# Patient Record
Sex: Female | Born: 1985 | Race: White | Hispanic: No | Marital: Married | Smoking: Never smoker
Health system: Southern US, Community
[De-identification: ages and names within clinical notes are randomized; demographics above are authoritative.]

## PROBLEM LIST (undated history)

## (undated) ENCOUNTER — Inpatient Hospital Stay (HOSPITAL_COMMUNITY): Payer: Self-pay

## (undated) DIAGNOSIS — D649 Anemia, unspecified: Secondary | ICD-10-CM

## (undated) DIAGNOSIS — G43909 Migraine, unspecified, not intractable, without status migrainosus: Secondary | ICD-10-CM

---

## 2004-12-27 ENCOUNTER — Emergency Department (HOSPITAL_COMMUNITY): Admission: EM | Admit: 2004-12-27 | Discharge: 2004-12-27 | Payer: Self-pay | Admitting: Emergency Medicine

## 2005-04-09 ENCOUNTER — Emergency Department (HOSPITAL_COMMUNITY): Admission: EM | Admit: 2005-04-09 | Discharge: 2005-04-09 | Payer: Self-pay | Admitting: Emergency Medicine

## 2005-07-26 ENCOUNTER — Other Ambulatory Visit: Admission: RE | Admit: 2005-07-26 | Discharge: 2005-07-26 | Payer: Self-pay | Admitting: Obstetrics and Gynecology

## 2006-03-31 ENCOUNTER — Inpatient Hospital Stay (HOSPITAL_COMMUNITY): Admission: AD | Admit: 2006-03-31 | Discharge: 2006-04-02 | Payer: Self-pay | Admitting: Obstetrics and Gynecology

## 2007-01-02 ENCOUNTER — Emergency Department (HOSPITAL_COMMUNITY): Admission: EM | Admit: 2007-01-02 | Discharge: 2007-01-02 | Payer: Self-pay | Admitting: Emergency Medicine

## 2007-05-08 ENCOUNTER — Emergency Department (HOSPITAL_COMMUNITY): Admission: EM | Admit: 2007-05-08 | Discharge: 2007-05-08 | Payer: Self-pay | Admitting: Emergency Medicine

## 2007-05-14 ENCOUNTER — Emergency Department (HOSPITAL_COMMUNITY): Admission: EM | Admit: 2007-05-14 | Discharge: 2007-05-14 | Payer: Self-pay | Admitting: Family Medicine

## 2007-07-09 ENCOUNTER — Emergency Department (HOSPITAL_COMMUNITY): Admission: EM | Admit: 2007-07-09 | Discharge: 2007-07-09 | Payer: Self-pay | Admitting: Emergency Medicine

## 2007-11-18 ENCOUNTER — Inpatient Hospital Stay (HOSPITAL_COMMUNITY): Admission: AD | Admit: 2007-11-18 | Discharge: 2007-11-18 | Payer: Self-pay | Admitting: Obstetrics and Gynecology

## 2007-12-16 ENCOUNTER — Ambulatory Visit: Payer: Self-pay | Admitting: Cardiovascular Disease

## 2007-12-18 ENCOUNTER — Emergency Department: Payer: Self-pay | Admitting: Emergency Medicine

## 2007-12-18 ENCOUNTER — Other Ambulatory Visit: Payer: Self-pay

## 2007-12-24 ENCOUNTER — Ambulatory Visit: Payer: Self-pay

## 2007-12-24 ENCOUNTER — Encounter: Payer: Self-pay | Admitting: Cardiovascular Disease

## 2008-01-12 ENCOUNTER — Ambulatory Visit: Payer: Self-pay | Admitting: Cardiovascular Disease

## 2008-02-02 ENCOUNTER — Inpatient Hospital Stay (HOSPITAL_COMMUNITY): Admission: RE | Admit: 2008-02-02 | Discharge: 2008-02-04 | Payer: Self-pay | Admitting: Obstetrics and Gynecology

## 2008-02-05 ENCOUNTER — Encounter: Admission: RE | Admit: 2008-02-05 | Discharge: 2008-02-11 | Payer: Self-pay | Admitting: Obstetrics and Gynecology

## 2008-03-16 ENCOUNTER — Ambulatory Visit: Payer: Self-pay | Admitting: Cardiovascular Disease

## 2008-03-16 DIAGNOSIS — R002 Palpitations: Secondary | ICD-10-CM | POA: Insufficient documentation

## 2008-03-16 DIAGNOSIS — I959 Hypotension, unspecified: Secondary | ICD-10-CM | POA: Insufficient documentation

## 2008-03-16 DIAGNOSIS — I080 Rheumatic disorders of both mitral and aortic valves: Secondary | ICD-10-CM | POA: Insufficient documentation

## 2008-05-05 ENCOUNTER — Emergency Department (HOSPITAL_COMMUNITY): Admission: EM | Admit: 2008-05-05 | Discharge: 2008-05-05 | Payer: Self-pay | Admitting: Emergency Medicine

## 2008-05-09 ENCOUNTER — Emergency Department (HOSPITAL_COMMUNITY): Admission: EM | Admit: 2008-05-09 | Discharge: 2008-05-09 | Payer: Self-pay | Admitting: Emergency Medicine

## 2008-07-28 ENCOUNTER — Emergency Department (HOSPITAL_COMMUNITY): Admission: EM | Admit: 2008-07-28 | Discharge: 2008-07-28 | Payer: Self-pay | Admitting: Emergency Medicine

## 2008-08-18 IMAGING — CT CT ORBIT/TEMPORAL/IAC W/O CM
3 series · 14 of 47 positions shown, 16 images · non-contrast
Comparison: none

CLINICAL DATA: Fall. Left periorbital laceration.

MAXILLOFACIAL CT WITHOUT CONTRAST
TECHNIQUE: Axial and coronal plane CT imaging of the maxillofacial structures
was performed including the facial bones, paranasal sinuses, and orbits.  No
intravenous contrast was administered.

[Series 4: orbit 2.0 h30s st · axial · 0.32mm/px · z∈[-122,-46]mm · 8 of 46 slices shown, 10 images]
[im 4/46  brain]
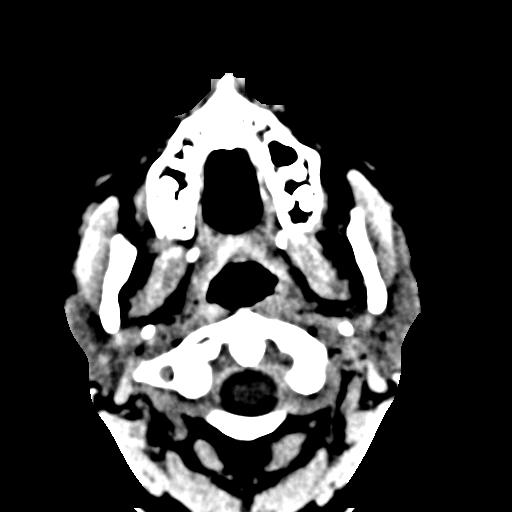
[im 4/46  bone]
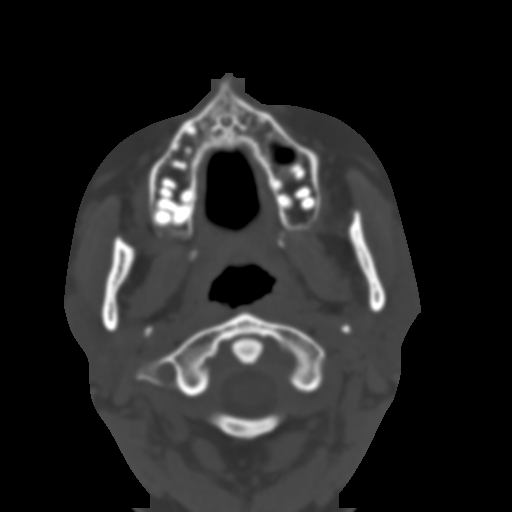
[im 10/46  bone]
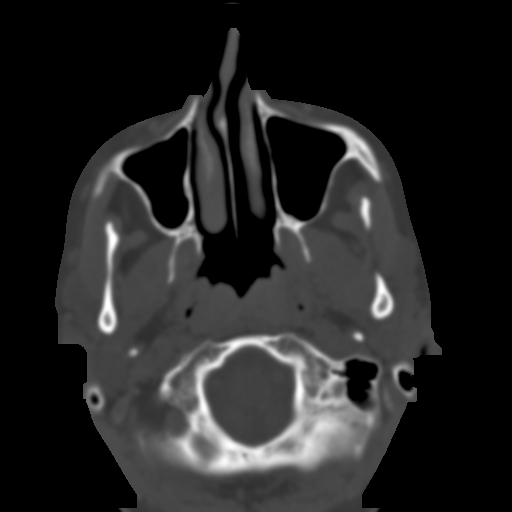
[im 14/46  bone]
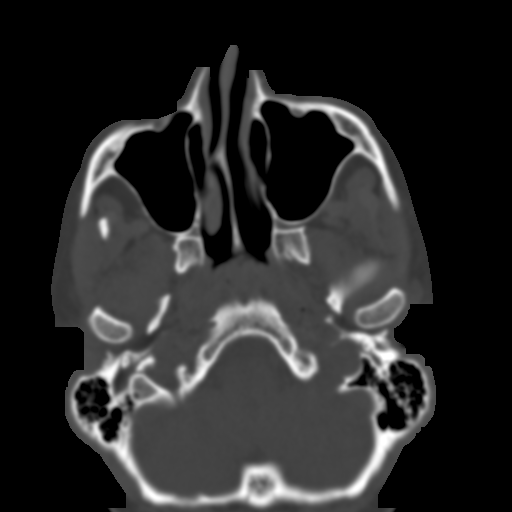
[im 21/46  bone]
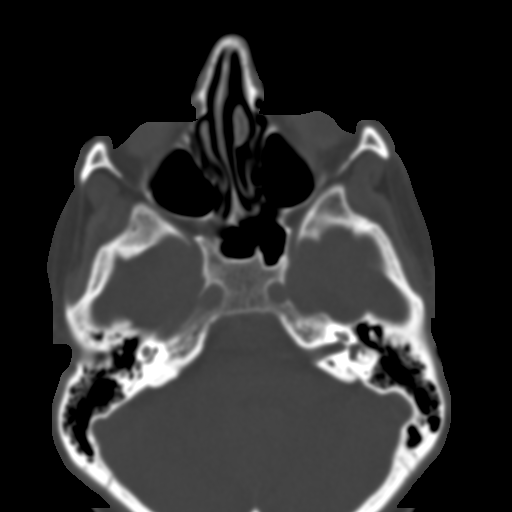
[im 25/46  brain]
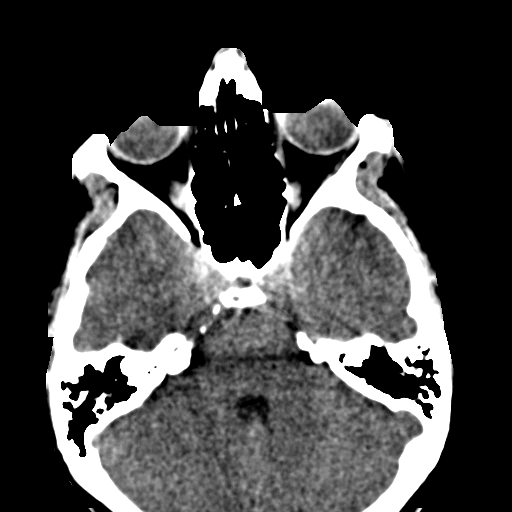
[im 25/46  bone]
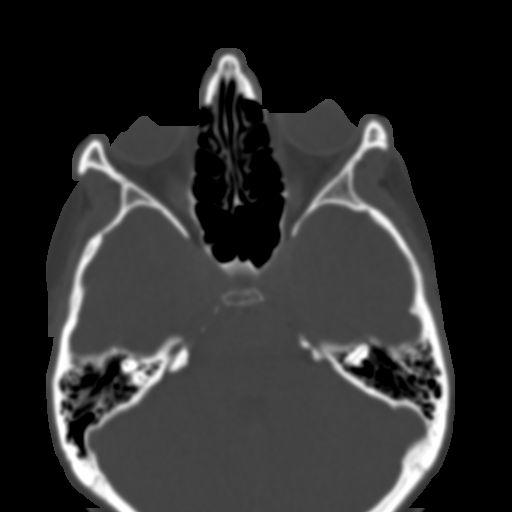
[im 32/46  bone]
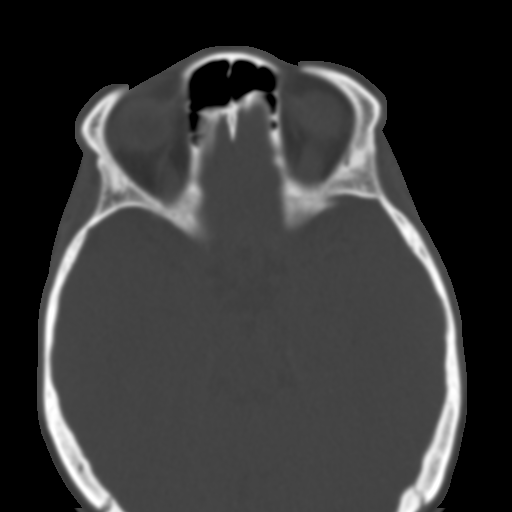
[im 36/46  bone]
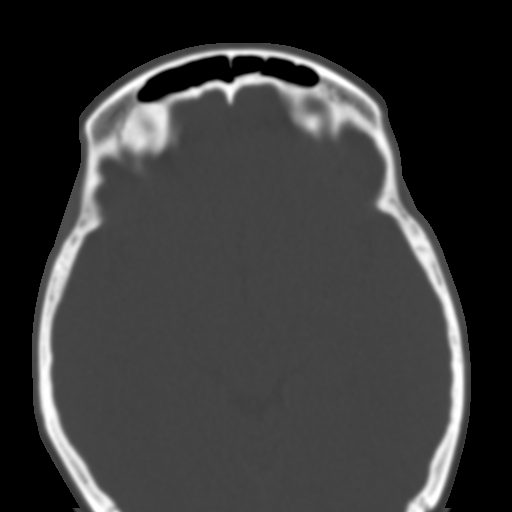
[im 42/46  bone]
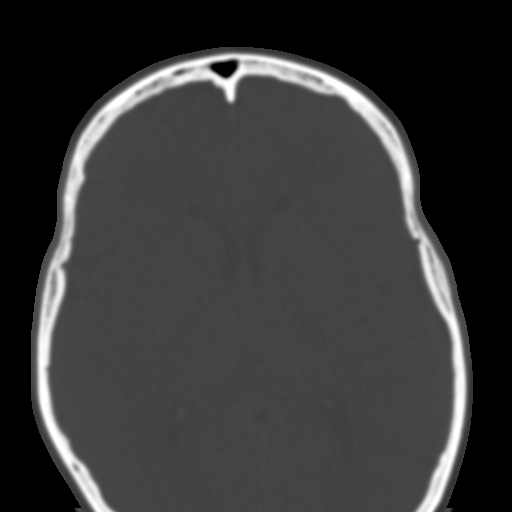

[Series 5: orbit 2.0 spo · coronal · 0.18mm/px · 3 of 79 slices shown (1 of 2)]
[im 27/79  bone]
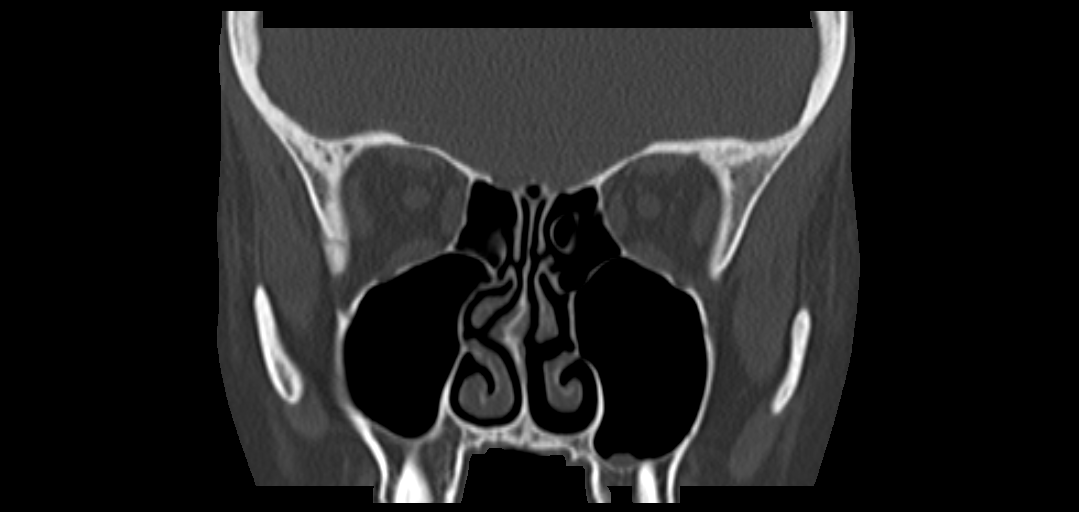
[im 35/79  bone]
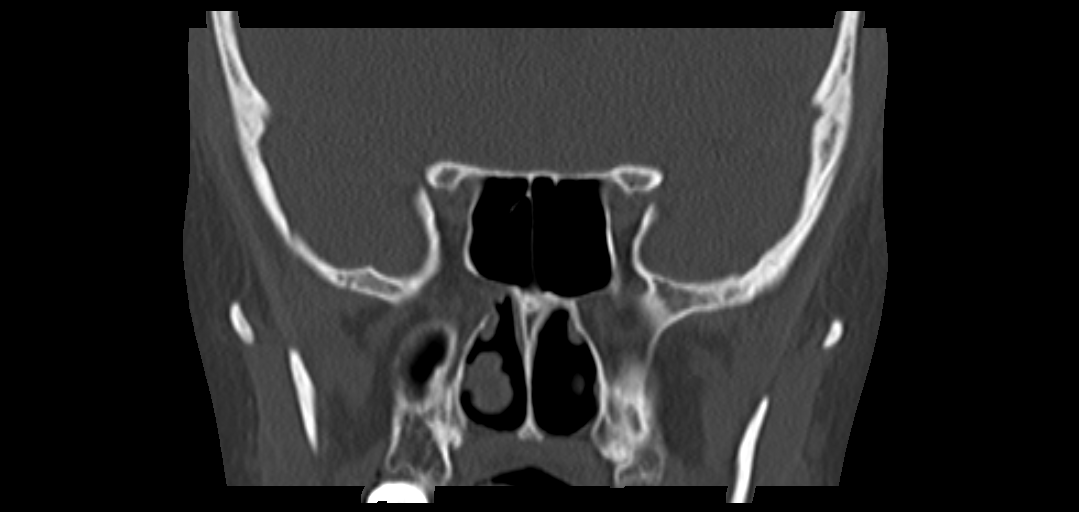
[im 44/79  bone]
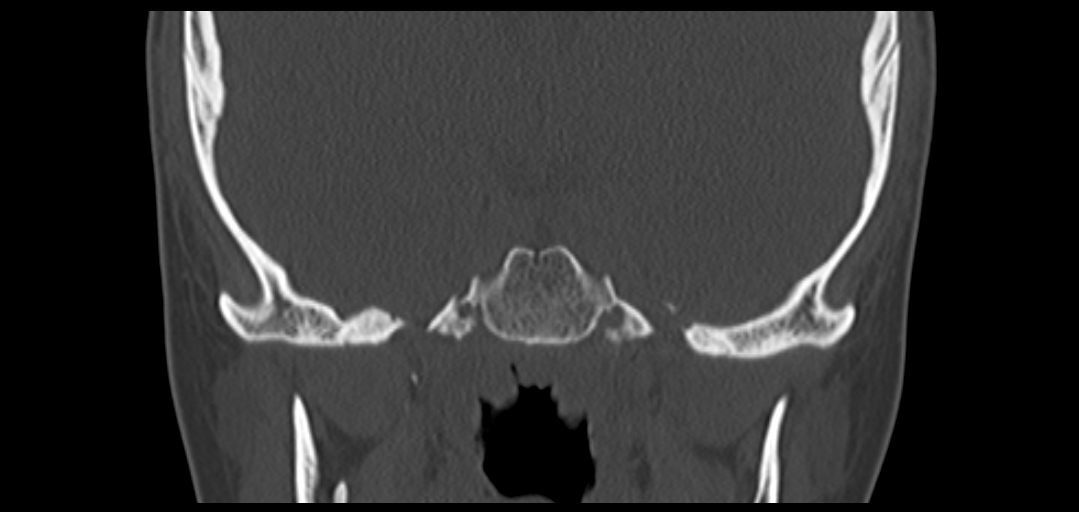

[Series 6: orbit 2.0 spo · sagittal · 0.23mm/px · 3 of 80 slices shown (2 of 2)]
[im 27/80  bone]
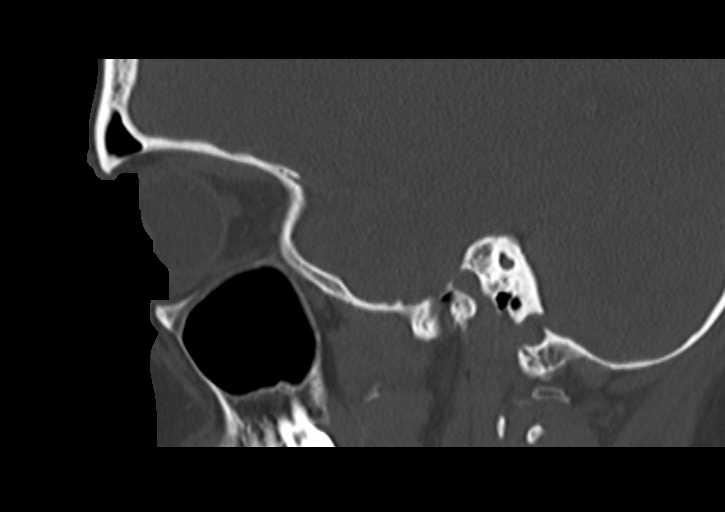
[im 40/80  bone]
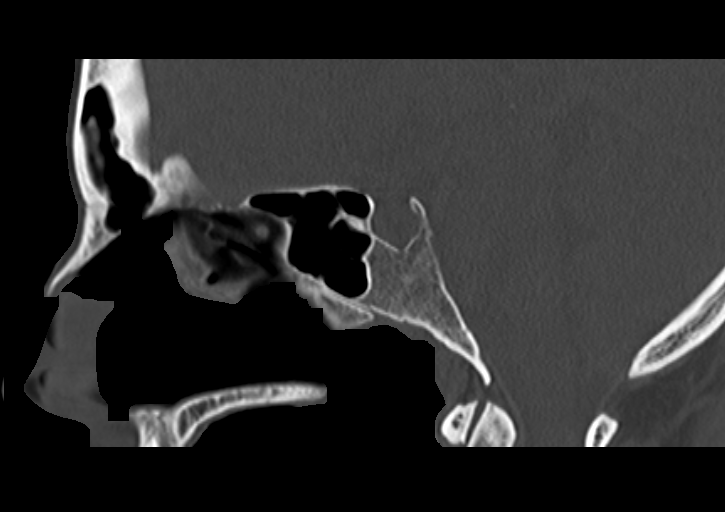
[im 53/80  bone]
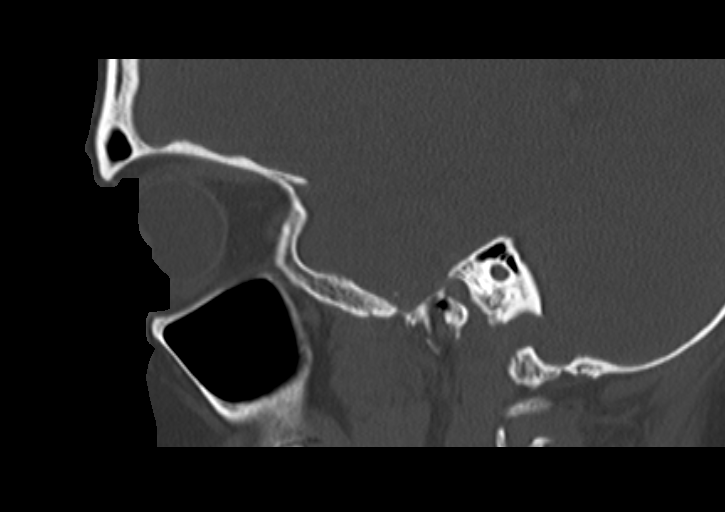

[14 of 47 positions shown; findings below may reference images not displayed]

FINDINGS: No prior for comparison. minimal left zygomatic soft tissue swelling.
Normal orbits and globes. No fracture-dislocation. No fluid in the paranasal
sinuses.
IMPRESSION

Mild left periorbital soft tissue swelling without acute osseous abnormality.

## 2009-12-24 ENCOUNTER — Inpatient Hospital Stay (HOSPITAL_COMMUNITY): Admission: AD | Admit: 2009-12-24 | Discharge: 2010-01-02 | Payer: Self-pay | Admitting: Obstetrics and Gynecology

## 2010-05-31 LAB — CBC
HCT: 22.8 % — ABNORMAL LOW (ref 36.0–46.0)
Hemoglobin: 8.1 g/dL — ABNORMAL LOW (ref 12.0–15.0)
MCH: 34 pg (ref 26.0–34.0)
MCHC: 35.6 g/dL (ref 30.0–36.0)
MCV: 95.6 fL (ref 78.0–100.0)
Platelets: 152 10*3/uL (ref 150–400)
RBC: 2.38 MIL/uL — ABNORMAL LOW (ref 3.87–5.11)
RDW: 12.8 % (ref 11.5–15.5)
WBC: 9.8 10*3/uL (ref 4.0–10.5)

## 2010-06-01 LAB — CBC
HCT: 31.2 % — ABNORMAL LOW (ref 36.0–46.0)
HCT: 31.7 % — ABNORMAL LOW (ref 36.0–46.0)
HCT: 31.8 % — ABNORMAL LOW (ref 36.0–46.0)
Hemoglobin: 10.8 g/dL — ABNORMAL LOW (ref 12.0–15.0)
Hemoglobin: 11.1 g/dL — ABNORMAL LOW (ref 12.0–15.0)
Hemoglobin: 11.1 g/dL — ABNORMAL LOW (ref 12.0–15.0)
MCH: 33.2 pg (ref 26.0–34.0)
MCH: 33.4 pg (ref 26.0–34.0)
MCH: 33.6 pg (ref 26.0–34.0)
MCHC: 34.5 g/dL (ref 30.0–36.0)
MCHC: 35 g/dL (ref 30.0–36.0)
MCHC: 35 g/dL (ref 30.0–36.0)
MCV: 95.3 fL (ref 78.0–100.0)
MCV: 95.9 fL (ref 78.0–100.0)
MCV: 96.2 fL (ref 78.0–100.0)
Platelets: 177 10*3/uL (ref 150–400)
Platelets: 183 10*3/uL (ref 150–400)
Platelets: 188 10*3/uL (ref 150–400)
RBC: 3.24 MIL/uL — ABNORMAL LOW (ref 3.87–5.11)
RBC: 3.3 MIL/uL — ABNORMAL LOW (ref 3.87–5.11)
RBC: 3.33 MIL/uL — ABNORMAL LOW (ref 3.87–5.11)
RDW: 12.6 % (ref 11.5–15.5)
RDW: 12.6 % (ref 11.5–15.5)
RDW: 12.7 % (ref 11.5–15.5)
WBC: 11.5 10*3/uL — ABNORMAL HIGH (ref 4.0–10.5)
WBC: 8.4 10*3/uL (ref 4.0–10.5)
WBC: 9.1 10*3/uL (ref 4.0–10.5)

## 2010-06-01 LAB — COMPREHENSIVE METABOLIC PANEL
ALT: 10 U/L (ref 0–35)
ALT: 15 U/L (ref 0–35)
AST: 21 U/L (ref 0–37)
AST: 23 U/L (ref 0–37)
Albumin: 3 g/dL — ABNORMAL LOW (ref 3.5–5.2)
Albumin: 3.1 g/dL — ABNORMAL LOW (ref 3.5–5.2)
Alkaline Phosphatase: 88 U/L (ref 39–117)
Alkaline Phosphatase: 92 U/L (ref 39–117)
BUN: 6 mg/dL (ref 6–23)
BUN: 6 mg/dL (ref 6–23)
CO2: 23 mEq/L (ref 19–32)
CO2: 23 mEq/L (ref 19–32)
Calcium: 8.7 mg/dL (ref 8.4–10.5)
Calcium: 8.8 mg/dL (ref 8.4–10.5)
Chloride: 102 mEq/L (ref 96–112)
Chloride: 103 mEq/L (ref 96–112)
Creatinine, Ser: 0.31 mg/dL — ABNORMAL LOW (ref 0.4–1.2)
Creatinine, Ser: 0.36 mg/dL — ABNORMAL LOW (ref 0.4–1.2)
GFR calc Af Amer: >60 mL/min (ref >60)
GFR calc Af Amer: >60 mL/min (ref >60)
GFR calc non Af Amer: >60 mL/min (ref >60)
GFR calc non Af Amer: >60 mL/min (ref >60)
Glucose, Bld: 125 mg/dL — ABNORMAL HIGH (ref 70–99)
Glucose, Bld: 99 mg/dL (ref 70–99)
Potassium: 3.5 mEq/L (ref 3.5–5.1)
Potassium: 3.5 mEq/L (ref 3.5–5.1)
Sodium: 134 mEq/L — ABNORMAL LOW (ref 135–145)
Sodium: 135 mEq/L (ref 135–145)
Total Bilirubin: 0.2 mg/dL — ABNORMAL LOW (ref 0.3–1.2)
Total Bilirubin: 0.4 mg/dL (ref 0.3–1.2)
Total Protein: 6.3 g/dL (ref 6.0–8.3)
Total Protein: 6.8 g/dL (ref 6.0–8.3)

## 2010-06-01 LAB — STREP B DNA PROBE: Strep Group B Ag: NEGATIVE

## 2010-06-01 LAB — GC/CHLAMYDIA PROBE AMP, GENITAL
Chlamydia, DNA Probe: NEGATIVE
GC Probe Amp, Genital: NEGATIVE

## 2010-06-01 LAB — ABO/RH: ABO/RH(D): A POS

## 2010-06-01 LAB — RPR: RPR Ser Ql: NONREACTIVE

## 2010-08-01 NOTE — Assessment & Plan Note (Signed)
[facility]                            CARDIOLOGY OFFICE NOTE   [name], [name]                     MRN:          [id number]  DATE:03/16/2008                            DOB:          10/13/1985    [name] returns today for followup.  She delivered a healthy baby girl  on January 27, 2008.   She had a normal vaginal delivery without any significant problems.  She  is currently not taking any medications.  I have seen her for dyspnea,  presyncope, palpitations, and mild MR.  Since her delivery, she has felt  better.  Her palpitations have gone away.  She has not had any  presyncopal spells, although she still occasionally feels lightheaded  when she stands up quickly.  She tends to run a low blood pressure at  baseline.   I told her that I did not think this was pathological or evidence of  dysautonomia.  She needs to be careful when she gets sick and stay well  hydrated.   There is no evidence of significant structural heart disease.  She did  have mild MR on an echo in October and probably needs to followup echo  in 2 years.  There was no evidence of prolapse.   REVIEW OF SYSTEMS:  Otherwise negative.   MEDICATIONS:  She is only taking vitamins at this point.   ALLERGIES:  She has no known allergies.   PHYSICAL EXAMINATION:  GENERAL:  Remarkable for a healthy-appearing  female whose weight is still up 20 pounds from baseline, after her  pregnancy at 149.  VITAL SIGNS:  Blood pressure is 105/62 nonpostural, pulse 74 and  regular.  HEENT:  Unremarkable.  NECK:  Carotids are normal without bruit.  No lymphadenopathy,  thyromegaly, or JVP elevation.  LUNGS:  Clear, good diaphragmatic motion.  No wheezing.  S1 and S2  normal heart sounds.  PMI normal.  ABDOMEN:  Benign.  Bowel sounds positive.  No AAA, no tenderness, no  bruit, no hepatosplenomegaly, or hepatojugular reflux.  EXTREMITIES:  Distal pulses are intact.  No edema.  NEUROLOGIC:  Nonfocal.  No muscular weakness.  SKIN:  Warm and dry with a tattoo of [name] on her right deltoid area.   IMPRESSION:  1. Palpitations benign, likely related to volume overload and      pregnancy, currently gone.  No need for therapy.  2. Presyncope and no evidence of dysautonomia, baseline fairly low      blood pressure.  No need for further workup or tilt-table testing.  3. Mild mitral regurgitation.  Consider followup echo in 2 years.      Overall, I think the patient's      heart is stable.  I told her she could see [place] on a p.r.n. basis and      would likely need a followup echo in 2-3 years.     Noralyn Pick. Eden Emms, MD, [facility]  Electronically Signed    PCN/MedQ  DD: 03/16/2008  DT: 03/17/2008  Job #: [id number]

## 2010-08-01 NOTE — Discharge Summary (Signed)
[name], [name]            ACCOUNT NO.:  [id number]   MEDICAL RECORD NO.:  [id number]          PATIENT TYPE:  INP   LOCATION:  9102                          FACILITY:  [facility]   PHYSICIAN:  Sherron Monday, MD        DATE OF BIRTH:  [birth year]   DATE OF ADMISSION:  02/02/2008  DATE OF DISCHARGE:  02/05/2008                               DISCHARGE SUMMARY   ADMITTING DIAGNOSIS:  Intrauterine pregnancy at term, for induction of  labor.   DISCHARGE DIAGNOSES:  Intrauterine pregnancy at term, for induction of  labor, and delivered via spontaneous vaginal delivery.   HISTORY OF PRESENT ILLNESS:  A 25 year old G3, P1-0-1-1 at 39+ weeks for  induction of labor.  She has had good fetal movement, no loss of fluid,  no vaginal bleeding, and occasional contractions.  She has had  uncomplicated prenatal care except for her current baby as well as loss  of consciousness and heart palpitations, with evaluation by Cardiology,  with a normal echocardiogram.  Her EDC is by first trimester ultrasound.   PAST MEDICAL HISTORY:  Significant for migraines.   PAST SURGICAL HISTORY:  Not significant.   PAST OBSTETRIC/GYNECOLOGICAL HISTORY:  G1, therapeutic abortion.  G2 is  a term vaginal-delivery female infant.  G3 is the present pregnancy,  female infant.  She has history of abnormal Pap smear with colposcopy  and followup normal.  She also has a history of chlamydia.   MEDICATIONS:  Prenatal vitamins.   ALLERGIES:  No known drug allergies.   SOCIAL HISTORY:  She denies alcohol, tobacco, or drug use.   FAMILY HISTORY:  Her father has hypertension.  Maternal grandmother has  diabetes.  Maternal grandmother and paternal grandparents have lung  cancer.   PRENATAL LABORATORIES:  Hemoglobin 11.7, platelets 250,000, A positive,  antibody screen negative.  Pap smear within normal limits.  Gonorrhea  negative.  Chlamydia negative.  RPR nonreactive.  Rubella immune.  Cystic fibrosis screen negative.   Hepatitis B surface antigen negative.  HIV negative.  First trimester screen within normal limits.  AFP within  normal limits.  Glucola of 166, with a normal 3-hour GTT.  Group B strep  is negative.   Ultrasound performed at 18 weeks on September 05, 2007, with normal anatomy,  posterior placenta, and a female infant.  Normal first trimester screen  with a normal nuchal thickness.  EDC was established by 9-week  ultrasound.  Ultrasound on January 05, 2008, at 35 weeks gave an  estimated fetal weight of 6 pounds 10 ounces and a normal AFI.   HOSPITAL COURSE:  On admission, physical exam was benign, afebrile,  vital signs stable.  Fetal heart rate tones were in the 140s and  reactive.  Her membranes were ruptured for clear fluid.  She was given  Pitocin to augment her labor.  As she was group B strep negative, she  did not need prophylaxis.  She progressed rapidly to complete complete,  +1, and pushed well for about 20-30 minutes to deliver a viable female  infant at 26 on February 02, 2008, with Apgars of 9 at  one minute and  9 at five minutes and a weight of 8 pounds 8 ounces.  Placenta was  expressed intact at 1504, mild shoulder dystocia relieved with  McRoberts, delivered with OA to LOT.  Hemostatic left periurethral  laceration.  No other lacerations.  Mild atony was relieved with  bimanual massage and Methergine.  EBL was approximately 500 mL.  Postpartum course was relatively uncomplicated.  She remained afebrile  with stable vital signs throughout.  In general, she was in no apparent  distress.  Abdomen was soft and fundus nontender.  She is discharged to  home on postpartum day #2.  Routine discharge instructions with numbers  to call if any questions or problems.  She voices understanding to all  this.  She is A positive, rubella immune, breast-feeding, hemoglobin  10.2 to 9.0, and she will start Micronor for contraception at her 6-week  postnatal checkup.      Sherron Monday,  MD  Electronically Signed     JB/MEDQ  D:  02/04/2008  T:  02/04/2008  Job:  [id number]

## 2010-08-01 NOTE — Assessment & Plan Note (Signed)
[facility]                            CARDIOLOGY OFFICE NOTE   LAVIDA, PATCH                     MRN:          [id number]  DATE:12/16/2007                            DOB:          1985/10/30    A 25 year old patient referred for chest pain, presyncope, and shortness  of breath.   The patient is gravida 2, para 1.  Her due date is middle of November.  She is carrying a healthy baby girl.  She has had three episodes over  the last few weeks of chest pain, presyncope, and shortness of breath.  The episodes are non-stereotypical.  They have occurred morning, noon,  and evening.  They are not postural in nature.  She was sitting down on  all three occasions.  She gets a funny sensation in her chest with some  shortness of breath, subsequently feels lightheaded, and passes out.  When I asked her if she ever hurt herself, it became apparent that she  probably does not actually pass out, but feels very fatigued and faint.  There is no other evidence of vagal prodrome.  She does not have any  nausea or vomiting.  She has not had any rapid palpitations.  She does  occasionally feel that her heart flip-flops.  There has been no previous  history of sudden death in the family.  No history of atrial  arrhythmias.   There is no family history of long QT interval.   The patient's pregnancy has otherwise gone well.  She has gained about  22 pounds.  She is due to have a normal vaginal delivery in November.   She denies any significant drug use and is taking good care of herself,  is a nonsmoker and is eating appropriately.   REVIEW OF SYSTEMS:  Otherwise negative.  In regards to her pain in her  chest, it is atypical, it is nonexertional, it is in the center of her  chest, it is associated with shortness of breath.  There is no pleuritic  component.   She has not had previous surgery.  She has some fatigue and lower  extremity edema on review of  systems, otherwise negative.   She is not married, but the father of both her children is actively  involved.  She is a Biochemist, clinical.  She has a 50-month-  old boy, name Caryn Bee.  She does not smoke or drink.  She has been  sedentary.   FAMILY HISTORY:  Noncontributory.   She is currently just taking prenatal vitamins.  She has no known  allergies.   PHYSICAL EXAMINATION:  GENERAL:  Remarkable for healthy-appearing young  white female in no distress.  VITAL SIGNS:  Her blood pressure was 110/50, she is not pausable.  She  is in sinus rhythm at a 100-110, afebrile, weight is 155.  HEENT:  Unremarkable.  NECK:  Carotids normal without bruit.  No lymphadenopathy, thyromegaly,  or JVP elevation.  LUNGS:  Clear.  Good diaphragmatic motion.  No wheezing.  S1 and S2 with  normal heart sounds.  PMI not palpable.  ABDOMEN:  Protuberant consistent with gestational age.  Fetal motion  detected.  Bowel sounds positive.  No AAA.  No tenderness.  No bruit.  No hepatosplenomegaly or hepatojugular reflux.  EXTREMITIES:  Distal pulses are intact.  Trace edema.  NEUROLOGICAL:  Nonfocal.  SKIN:  Warm and dry.  MUSCULOSKELETAL:  No muscular weakness.   EKG shows sinus tach.  No other changes.  QT interval is 336.   IMPRESSION:  1. Palpitations.  They sound benign without paroxysmal      supraventricular tachycardia or occult arrhythmia.  Check 30-day      event monitor.  2. Chest pain and presyncope.  Prodrome sounds benign.  I suspect it      has to do with the state of her pregnancy and I doubt that this      represents significant cardiac problem.  We will check a 2-D      echocardiogram to assess right ventricular and left ventricular      function and to rule out any anomaly such as pericardial effusion.  3. Abnormal EKG with sinus tachycardia somewhat poor R-wave      progression, nonspecific ST-T wave changes.  No old EKG to compare.      We will rule out structural heart  disease with echo.   Overall, I think Anica should do fine.  Her symptoms sound  nonspecific and not clearly cardiac related.  I will see her back in 4  weeks after she has had her echo and time to have her event monitor at  home for a few weeks.     Noralyn Pick. Eden Emms, MD, [facility]  Electronically Signed    PCN/MedQ  DD: 12/16/2007  DT: 12/16/2007  Job #: [id number]   cc:   Sherron Monday, MD

## 2010-08-01 NOTE — Assessment & Plan Note (Signed)
[facility]                            CARDIOLOGY OFFICE NOTE   LAURE, LEONE                     MRN:          [id number]  DATE:01/12/2008                            DOB:          Aug 10, 1985    Ms. [name] returns today for followup and I had initially seen her on  December 16, 2007, for presyncope, palpitations, and dyspnea.   Her due date is now in November.  Her echo was normal.  She had a little  bit of MR which is not clinically significant.  Her EF was 60%.  She  only pressed the button on her event monitor once and it showed sinus  rhythm with sinus arrhythmia.   About a week after, I initially saw her.  She says she had an episode at  work.  She was sitting in her desk, she was tired.  She had nausea and  sounds like she had a vagal reaction.  She felt blackening out of her  eyes and difficulty hearing.  The episode lasted about 30 minutes.   After that she felt weak and went home from work.   There were no associated palpitations with this one.   I suspect the patient is sequestering fluid from her pregnancy and tends  to run low blood pressure.  She seems to have vagal episode sometime the  time.  I do not think this is primary cardiac problem or significant  dysautonomia.   She has significant sinus arrhythmia and sinus variation indicating an  intact autonomic nervous system.   She has no known allergies.  She is on prenatal vitamins.   PHYSICAL EXAMINATION:  GENERAL:  Remarkable for a pregnant female in no  distress.  VITAL SIGNS:  Blood pressure is 105/61, pulse 90 and regular, weight 164  which is up 9 pounds from December 16, 2007.  HEENT:  Unremarkable.  NECK:  Carotids are normal without bruit.  No lymphadenopathy,  thyromegaly, or JVP elevation.  LUNGS:  Clear.  Good diaphragmatic motion.  No wheezing.  S1 and S2 with  normal heart sounds.  PMI normal.  ABDOMEN:  Protuberant with consistent gestational age.  No  tenderness.  No bruit.  No hepatosplenomegaly or hepatojugular reflux.  Bowel sounds  are positive.  EXTREMITIES:  Distal pulses are intact.  Trace edema.  NEUROLOGICAL:  Nonfocal.  SKIN:  Warm and dry.  MUSCULOSKELETAL:  No muscular weakness.   Transtelephonic event monitor was reviewed.  Echocardiogram was  reviewed.  EF 60%, mild MR.   1. Episodes of presyncope.  They sound vagal in nature.  Doubt      significant arrhythmia, try to avoid lying flat.  If the patient is      lightheaded, sit down in left lateral decubitus position, stay well      hydrated, and continue to possibly use event monitor.  2. Mild mitral regurgitation, likely due to volume overload and state      of pregnancy.  Not clinically significant followup echo postpartum.  3. Palpitations.  Event monitor not showing any significant      arrhythmias.  No need for beta-blockade therapy at this time.   Overall, I think the patient is doing fine.  She should have an  uneventful delivery.  I will see her in the postpartum period to further  reassess her symptoms and see if they go away after her pregnancy is  over.     Noralyn Pick. Eden Emms, MD, [facility]  Electronically Signed    PCN/MedQ  DD: 01/12/2008  DT: 01/13/2008  Job #: [id number]

## 2010-08-04 NOTE — Discharge Summary (Signed)
[name], [name]            ACCOUNT NO.:  [id number]   MEDICAL RECORD NO.:  [id number]          PATIENT TYPE:  INP   LOCATION:  9113                          FACILITY:  [facility]   PHYSICIAN:  Sherron Monday, MD        DATE OF BIRTH:  03/14/1986   DATE OF ADMISSION:  03/31/2006  DATE OF DISCHARGE:  04/02/2006                               DISCHARGE SUMMARY   ADMISSION DIAGNOSIS:  Intrauterine pregnancy at term, laboring.   DISCHARGE DIAGNOSIS:  Intrauterine pregnancy at term, laboring.  Delivered by spontaneous vaginal delivery.   HISTORY OF PRESENT ILLNESS:  A 25 year old gravida 2, para 0-0-1-0 at 17-  5/7 weeks with increased vaginal bleeding and increased contractions in  frequency and intensity of these contractions, loss of fluid and good  fetal movement.  Her EDC is April 03, 2006, by an 11-week ultrasound  which dated her pregnancy in June 2007, and ultrasound performed on  November 05, 2005, at 18 weeks and 5 days revealed normal anatomy, and a  female infant.   PAST MEDICAL HISTORY:  Significant for history of migraines.   SOCIAL HISTORY:  denies alcohol, tobacco or drugs and is married.   OBSTETRIC/GYNECOLOGICAL HISTORY:  G1 was a SAB .  G2 is present  pregnancy, no complications.  She has a history of an abnormal Pap smear  with a colposcopy.  She has a history of Chlamydia remotely in 2001.   MEDICATIONS:  None.   ALLERGIES:  No known drug allergies.   SOCIAL HISTORY:  Negative X3.   FAMILY HISTORY:  Significant for hypertension in her father.  Paternal  grandparents with lung cancer and diabetes in maternal grandmother.   PRENATAL LABORATORIES:  Hemoglobin 11.6, platelets 286,000.  A positive.  Antibody screen negative.  UA was positive for Group B strep.  Pap smear  within normal limits.  Gonorrhea negative.  Chlamydia negative.  RPR  nonreactive, rubella immune.  Hepatitis B surface antigen negative.  HIV  negative.  AFP within normal limits.  Glucola 135.  3  hour GTT 160, 129  and 123.  Cystic fibrosis screen was negative.   PHYSICAL EXAMINATION:  VITAL SIGNS:  Afebrile.  Vital signs stable.  Non-  benign exam.  On admission, fetal heart tones were in the 130's and reactive.  She was  contracting every 2-5 minutes.  Her exam was 5-6, 90 and -1.  _After an  IV was started AROM was performed for clear fluid.  She was admitted to  Labor and Delivery and given pitocin to augment her labor.  The patient  progressed to complete complete and plus two and pushed for  delivery of  a viable female infant on January 13.  Apgars of 8 at 1 minute and 9 at 5  minutes.  Placenta was delivered intact at 1644.  She had hemostatic  periurethral and peroneal lacerations, so those suture repairs were  performed.  EBL was less than 500. Her postpartum course was  uncomplicated.  She remained afebrile with stable vital signs  throughout.  Her pain was controlled.  She was discharged to home with  routine discharge instructions and numbers to call with any questions or  problems.  She was also given prescriptions for Motrin, Vicodin,  prenatal vitamins and iron.   DISCHARGE INFORMATION:  She plans to bottle feed.  She was A positive,  rubella immune, hemoglobin 11.1 to 9.9.  She plans to use oral  contraceptive pills for birth control.  She will follow up in  approximately 6 weeks.      Sherron Monday, MD  Electronically Signed     JB/MEDQ  D:  04/02/2006  T:  04/02/2006  Job:  [id number]

## 2010-12-12 LAB — URINE CULTURE: Colony Count: 8000

## 2010-12-12 LAB — POCT URINALYSIS DIP (DEVICE)
Bilirubin Urine: NEGATIVE
Glucose, UA: NEGATIVE
Hgb urine dipstick: NEGATIVE
Nitrite: NEGATIVE
Operator id: 239701
Protein, ur: NEGATIVE
Specific Gravity, Urine: 1.015
Urobilinogen, UA: 0.2
pH: 6

## 2010-12-12 LAB — INFLUENZA A AND B ANTIGEN (CONVERTED LAB)
Inflenza A Ag: NEGATIVE
Influenza B Ag: NEGATIVE

## 2010-12-12 LAB — POCT RAPID STREP A: Streptococcus, Group A Screen (Direct): NEGATIVE

## 2010-12-19 LAB — CBC
HCT: 26.2 — ABNORMAL LOW
HCT: 29.8 — ABNORMAL LOW
Hemoglobin: 10.2 — ABNORMAL LOW
Hemoglobin: 9 — ABNORMAL LOW
MCHC: 34.2
MCHC: 34.4
MCV: 92.8
MCV: 92.9
Platelets: 195
Platelets: 234
RBC: 2.82 — ABNORMAL LOW
RBC: 3.21 — ABNORMAL LOW
RDW: 13.9
RDW: 14
WBC: 12.8 — ABNORMAL HIGH
WBC: 8.2

## 2010-12-19 LAB — RPR: RPR Ser Ql: NONREACTIVE

## 2010-12-27 LAB — POCT RAPID STREP A: Streptococcus, Group A Screen (Direct): NEGATIVE

## 2011-12-07 ENCOUNTER — Encounter (HOSPITAL_COMMUNITY): Payer: Self-pay | Admitting: Emergency Medicine

## 2011-12-07 DIAGNOSIS — R112 Nausea with vomiting, unspecified: Secondary | ICD-10-CM | POA: Insufficient documentation

## 2011-12-07 DIAGNOSIS — G43909 Migraine, unspecified, not intractable, without status migrainosus: Secondary | ICD-10-CM | POA: Insufficient documentation

## 2011-12-07 NOTE — ED Notes (Signed)
C/o headache x 4 days with nausea and light sensitivity.  Reports vomited yesterday.

## 2011-12-08 ENCOUNTER — Emergency Department (HOSPITAL_COMMUNITY)
Admission: EM | Admit: 2011-12-08 | Discharge: 2011-12-08 | Disposition: A | Discharge status: Home / Self Care | Payer: Self-pay | Attending: Emergency Medicine | Admitting: Emergency Medicine

## 2011-12-08 DIAGNOSIS — G43909 Migraine, unspecified, not intractable, without status migrainosus: Secondary | ICD-10-CM

## 2011-12-08 HISTORY — DX: Migraine, unspecified, not intractable, without status migrainosus: G43.909

## 2011-12-08 MED ORDER — ONDANSETRON 4 MG PO TBDP
ORAL_TABLET | ORAL | Status: AC
  Filled 2011-12-08: qty 1

## 2011-12-08 MED ORDER — KETOROLAC TROMETHAMINE 30 MG/ML IJ SOLN
30.0000 mg | Freq: Once | INTRAMUSCULAR | Status: AC
  Administered 2011-12-08: 30 mg via INTRAMUSCULAR

## 2011-12-08 MED ORDER — ONDANSETRON 4 MG PO TBDP
4.0000 mg | ORAL_TABLET | Freq: Once | ORAL | Status: AC
  Administered 2011-12-08: 4 mg via ORAL

## 2011-12-08 MED ORDER — KETOROLAC TROMETHAMINE 30 MG/ML IJ SOLN
INTRAMUSCULAR | Status: AC
  Filled 2011-12-08: qty 1

## 2011-12-08 NOTE — ED Provider Notes (Signed)
History     CSN: [id number]  Arrival date & time 12/07/11  2039   First MD Initiated Contact with Patient 12/08/11 0016      Chief Complaint  Patient presents with  . Headache    (Consider location/radiation/quality/duration/timing/severity/associated sxs/prior treatment) HPI Comments: Patient states, that she has a history of migraine headaches, usually located time.  The left eye and radiating to the base of the neck.  She reports, that for the past 4, days.  She's intermittently had headache, pain, been taking over-the-counter ibuprofen/Advil without complete resolution had nausea, and vomited once yesterday  Patient is a 26 y.o. female presenting with headaches. The history is provided by the patient.  Headache  This is a recurrent problem. The current episode started more than 2 days ago. The headache is associated with nothing. Associated symptoms include nausea. Pertinent negatives include no fever and no vomiting.    Past Medical History  Diagnosis Date  . Migraine headache     History reviewed. No pertinent past surgical history.  No family history on file.  History  Substance Use Topics  . Smoking status: Never Smoker   . Smokeless tobacco: Not on file  . Alcohol Use: No    OB History    Grav Para Term Preterm Abortions TAB SAB Ect Mult Living                  Review of Systems  Constitutional: Negative for fever and chills.  Gastrointestinal: Positive for nausea. Negative for vomiting.  Neurological: Positive for headaches. Negative for dizziness and weakness.    Allergies  Review of patient's allergies indicates no known allergies.  Home Medications   Current Outpatient Rx  Name Route Sig Dispense Refill  . NORGESTIM-ETH ESTRAD TRIPHASIC 0.18/0.215/0.25 MG-25 MCG PO TABS Oral Take 1 tablet by mouth daily.      BP 100/67  Pulse 70  Temp 97.7 F (36.5 C) (Oral)  Resp 16  SpO2 100%  LMP 11/30/2011  Physical Exam  Constitutional: She  appears well-developed and well-nourished.  HENT:  Head: Normocephalic.  Eyes: Pupils are equal, round, and reactive to light.  Neck: Normal range of motion.  Cardiovascular: Normal rate.   Pulmonary/Chest: Effort normal.  Musculoskeletal: Normal range of motion.  Neurological: She is alert.  Skin: Skin is warm. No rash noted.    ED Course  Procedures (including critical care time)  Labs Reviewed - No data to display No results found.   1. Migraine headache       MDM   ]will treat this patient with IM Toradol and ODT Zofran She drove herself to the ED with her 14 year old child  States HA starting to resolve        Arman Filter, NP 12/08/11 0040  Arman Filter, NP 12/08/11 0102  Arman Filter, NP 12/08/11 4098

## 2011-12-08 NOTE — ED Notes (Signed)
Spoke with Kayla Rolls NP in FT, got okay to move to FT; pt reports that migraine is similar to ones that she has had in the past, just is lasting longer

## 2011-12-08 NOTE — ED Notes (Signed)
Pt alert and oriented, with steady gait at time of discharge. Pt given discharge papers and papers explained. All questions answered and pt walked to discharge.  

## 2013-03-13 ENCOUNTER — Emergency Department (HOSPITAL_COMMUNITY)
Admission: EM | Admit: 2013-03-13 | Discharge: 2013-03-13 | Disposition: A | Discharge status: Home / Self Care | Payer: BC Managed Care – PPO | Source: Home / Self Care | Attending: Emergency Medicine | Admitting: Emergency Medicine

## 2013-03-13 ENCOUNTER — Encounter (HOSPITAL_COMMUNITY): Payer: Self-pay | Admitting: Emergency Medicine

## 2013-03-13 DIAGNOSIS — J111 Influenza due to unidentified influenza virus with other respiratory manifestations: Secondary | ICD-10-CM

## 2013-03-13 MED ORDER — OSELTAMIVIR PHOSPHATE 75 MG PO CAPS: 75.0000 mg | ORAL_CAPSULE | Freq: Two times a day (BID) | ORAL | Status: DC

## 2013-03-13 NOTE — ED Notes (Signed)
C/o flu like symptoms.  Body aches runny nose congestion fatigue diarrhea and dizzy from time to time.  Pt states son has the flu.   Pt has tried day quill with no relief.

## 2013-03-13 NOTE — ED Provider Notes (Signed)
Chief Complaint:   Chief Complaint  Patient presents with  . 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 Her 81-year-old son was diagnosed with influenza by a nasal swab. He is on Tamiflu. The patient reports symptoms of fatigue, myalgias, subjective fever, chills, and sweats. She's had nasal congestion with clear rhinorrhea, sinus pressure, and headache. She also has had some loose stools. She denies any sore throat, stiff neck, cough, wheezing, shortness of breath, nausea, vomiting, or abdominal pain.  Review of Systems:  Other than noted above, the patient denies any of the following symptoms: Systemic:  No fevers, chills, sweats, weight loss or gain, fatigue, or tiredness. Eye:  No redness or discharge. ENT:  No ear pain, drainage, headache, nasal congestion, drainage, sinus pressure, difficulty swallowing, or sore throat. Neck:  No neck pain or swollen glands. Lungs:  No cough, sputum production, hemoptysis, wheezing, chest tightness, shortness of breath or chest pain. GI:  No abdominal pain, nausea, vomiting or diarrhea.  PMFSH:  Past medical history, family history, social history, meds, and allergies were reviewed. Her only medication is birth control pills.  Physical Exam:   Vital signs:  BP 119/75  Pulse 109  Temp(Src) 98.7 F (37.1 C) (Oral)  Resp 18  SpO2 98%  LMP 02/11/2013 General:  Alert and oriented.  In no distress.  Skin warm and dry. Eye:  No conjunctival injection or drainage. Lids were normal. ENT:  TMs and canals were normal, without erythema or inflammation.  Nasal mucosa was clear and uncongested, without drainage.  Mucous membranes were moist.  Pharynx was clear with no exudate or drainage.  There were no oral ulcerations or lesions. Neck:  Supple, no adenopathy, tenderness or mass. Lungs:  No respiratory distress.  Lungs were clear to auscultation, without  wheezes, rales or rhonchi.  Breath sounds were clear and equal bilaterally.  Heart:  Regular rhythm, without gallops, murmers or rubs. Skin:  Clear, warm, and dry, without rash or lesions.  Assessment:  The encounter diagnosis was Influenza-like illness.  Plan:   1.  Meds:  The following meds were prescribed:   New Prescriptions   OSELTAMIVIR (TAMIFLU) 75 MG CAPSULE    Take 1 capsule (75 mg total) by mouth every 12 (twelve) hours.    2.  Patient Education/Counseling:  The patient was given appropriate handouts, self care instructions, and instructed in symptomatic relief.  Suggested decongestants and nasal sprays.  3.  Follow up:  The patient was told to follow up if no better in 3 to 4 days, if becoming worse in any way, and given some red flag symptoms such as difficulty breathing or chest pain which would prompt immediate return.  Follow up here if necessary.      Reuben Likes, MD 03/13/13 [phone]

## 2013-12-03 ENCOUNTER — Emergency Department (HOSPITAL_COMMUNITY)
Admission: EM | Admit: 2013-12-03 | Discharge: 2013-12-03 | Disposition: A | Discharge status: Home / Self Care | Payer: BC Managed Care – PPO | Attending: Emergency Medicine | Admitting: Emergency Medicine

## 2013-12-03 ENCOUNTER — Encounter (HOSPITAL_COMMUNITY): Payer: Self-pay | Admitting: Emergency Medicine

## 2013-12-03 DIAGNOSIS — G43839 Menstrual migraine, intractable, without status migrainosus: Secondary | ICD-10-CM | POA: Diagnosis not present

## 2013-12-03 DIAGNOSIS — Z79899 Other long term (current) drug therapy: Secondary | ICD-10-CM | POA: Insufficient documentation

## 2013-12-03 DIAGNOSIS — G8929 Other chronic pain: Secondary | ICD-10-CM | POA: Insufficient documentation

## 2013-12-03 DIAGNOSIS — G43909 Migraine, unspecified, not intractable, without status migrainosus: Secondary | ICD-10-CM | POA: Insufficient documentation

## 2013-12-03 MED ORDER — SUMATRIPTAN SUCCINATE 25 MG PO TABS: 25.0000 mg | ORAL_TABLET | Freq: Once | ORAL | Status: DC

## 2013-12-03 MED ORDER — KETOROLAC TROMETHAMINE 30 MG/ML IJ SOLN
30.0000 mg | Freq: Once | INTRAMUSCULAR | Status: AC
  Administered 2013-12-03: 30 mg via INTRAVENOUS
  Filled 2013-12-03: qty 1

## 2013-12-03 MED ORDER — SUMATRIPTAN 20 MG/ACT NA SOLN: 20.0000 mg | NASAL | Status: DC | PRN

## 2013-12-03 MED ORDER — SUMATRIPTAN 20 MG/ACT NA SOLN
20.0000 mg | Freq: Once | NASAL | Status: AC
  Administered 2013-12-03: 20 mg via NASAL
  Filled 2013-12-03: qty 1

## 2013-12-03 MED ORDER — KETOROLAC TROMETHAMINE 30 MG/ML IJ SOLN: 60.0000 mg | Freq: Once | INTRAMUSCULAR | Status: DC

## 2013-12-03 NOTE — ED Provider Notes (Signed)
CSN: [id number]     Arrival date & time 12/03/13  0344 History   First MD Initiated Contact with Patient 12/03/13 0515     Chief Complaint  Patient presents with  . Migraine     (Consider location/radiation/quality/duration/timing/severity/associated sxs/prior Treatment) HPI 28 yo female presents to the ER from home with complaint of migraine headache.  Pt reports she has had monthly headaches associated with her menstrual cycle.  Usually headaches are controlled with excedrin migraine +/- diet coke.  This headache has lasted longer, started Sunday and has been near constant.  She does have photophobia, mild nausea.  Location of headache is different this time, at top of head versus usual location behind ear.   Past Medical History  Diagnosis Date  . Migraine headache    Past Surgical History  Procedure Laterality Date  . Cesarean section     No family history on file. History  Substance Use Topics  . Smoking status: Never Smoker   . Smokeless tobacco: Not on file  . Alcohol Use: No   OB History   Grav Para Term Preterm Abortions TAB SAB Ect Mult Living                 Review of Systems  All other systems reviewed and are negative.     Allergies  Review of patient's allergies indicates no known allergies.  Home Medications   Prior to Admission medications   Medication Sig Start Date End Date Taking? Authorizing Provider  aspirin-acetaminophen-caffeine (EXCEDRIN MIGRAINE) [phone] MG per tablet Take 1 tablet by mouth every 6 (six) hours as needed for headache.   Yes Historical Provider, MD  ibuprofen (ADVIL,MOTRIN) 200 MG tablet Take 600-800 mg by mouth every 6 (six) hours as needed for moderate pain.   Yes Historical Provider, MD  Norgestimate-Ethinyl Estradiol Triphasic (ORTHO TRI-CYCLEN LO) 0.18/0.215/0.25 MG-25 MCG tab Take 1 tablet by mouth daily.   Yes Historical Provider, MD   BP 99/57  Pulse 60  Temp(Src) 97.8 F (36.6 C) (Oral)  Ht  (1.575 m)  Wt  150 lb (68.04 kg)  BMI 27.43 kg/m2  SpO2 100%  LMP 11/28/2013 Physical Exam  Nursing note and vitals reviewed. Constitutional: She is oriented to person, place, and time. She appears well-developed and well-nourished.  HENT:  Head: Normocephalic and atraumatic.  Right Ear: External ear normal.  Left Ear: External ear normal.  Nose: Nose normal.  Mouth/Throat: Oropharynx is clear and moist.  Eyes: Conjunctivae and EOM are normal. Pupils are equal, round, and reactive to light.  Neck: Normal range of motion. Neck supple. No JVD present. No tracheal deviation present. No thyromegaly present.  Cardiovascular: Normal rate, regular rhythm, normal heart sounds and intact distal pulses.  Exam reveals no gallop and no friction rub.   No murmur heard. Pulmonary/Chest: Effort normal and breath sounds normal. No stridor. No respiratory distress. She has no wheezes. She has no rales. She exhibits no tenderness.  Abdominal: Soft. Bowel sounds are normal. She exhibits no distension and no mass. There is no tenderness. There is no rebound and no guarding.  Musculoskeletal: Normal range of motion. She exhibits no edema and no tenderness.  Lymphadenopathy:    She has no cervical adenopathy.  Neurological: She is alert and oriented to person, place, and time. She displays normal reflexes. No cranial nerve deficit. She exhibits normal muscle tone. Coordination normal.  Skin: Skin is warm and dry. No rash noted. No erythema. No pallor.  Psychiatric: She has a  normal mood and affect. Her behavior is normal. Judgment and thought content normal.    ED Course  Procedures (including critical care time) Labs Review Labs Reviewed - No data to display  Imaging Review No results found.   EKG Interpretation None      MDM   Final diagnoses:  Intractable menstrual migraine without status migrainosus    28 yo female with acute on chronic headaches.  Her description seems c/w migraine.  Will give toradol  and imitrex and f/u with neuro/headache clinic   Olivia Mackie, MD 12/03/13 [phone]

## 2013-12-03 NOTE — ED Notes (Signed)
Pt A&Ox4, ambulatory at d/c with steady gait, NAD 

## 2013-12-03 NOTE — ED Notes (Signed)
Pt. reports migraine headache onset last Saturday unrelieved by OTC Exedrin tabs , denies nausea or vomitting / no blurred vision .

## 2013-12-03 NOTE — Discharge Instructions (Signed)

## 2013-12-13 ENCOUNTER — Encounter (HOSPITAL_COMMUNITY): Payer: Self-pay | Admitting: Emergency Medicine

## 2013-12-13 ENCOUNTER — Emergency Department (HOSPITAL_COMMUNITY)
Admission: EM | Admit: 2013-12-13 | Discharge: 2013-12-13 | Discharge status: Against Medical Advice | Payer: BC Managed Care – PPO | Attending: Emergency Medicine | Admitting: Emergency Medicine

## 2013-12-13 DIAGNOSIS — G43909 Migraine, unspecified, not intractable, without status migrainosus: Secondary | ICD-10-CM | POA: Insufficient documentation

## 2013-12-13 NOTE — ED Notes (Signed)
called for pt with no answer

## 2013-12-13 NOTE — ED Notes (Signed)
Called for pt with no answer 

## 2013-12-13 NOTE — ED Notes (Signed)
Pt c/o generalized migraine x 2 days with nausea and blurry vision; pt with hx of same and seen here last week for same

## 2016-02-17 LAB — OB RESULTS CONSOLE GC/CHLAMYDIA
Chlamydia: NEGATIVE
Gonorrhea: NEGATIVE

## 2016-02-17 LAB — OB RESULTS CONSOLE ABO/RH: RH Type: POSITIVE

## 2016-02-17 LAB — OB RESULTS CONSOLE HEPATITIS B SURFACE ANTIGEN: HEP B S AG: NEGATIVE

## 2016-02-17 LAB — OB RESULTS CONSOLE RPR: RPR: NONREACTIVE

## 2016-02-17 LAB — OB RESULTS CONSOLE ANTIBODY SCREEN: ANTIBODY SCREEN: NEGATIVE

## 2016-02-17 LAB — OB RESULTS CONSOLE HIV ANTIBODY (ROUTINE TESTING): HIV: NONREACTIVE

## 2016-02-17 LAB — OB RESULTS CONSOLE RUBELLA ANTIBODY, IGM: Rubella: IMMUNE

## 2016-03-10 ENCOUNTER — Encounter (HOSPITAL_COMMUNITY): Payer: Self-pay | Admitting: Nurse Practitioner

## 2016-03-10 ENCOUNTER — Emergency Department (HOSPITAL_COMMUNITY)
Admission: EM | Admit: 2016-03-10 | Discharge: 2016-03-10 | Disposition: A | Discharge status: Home / Self Care | Payer: BC Managed Care – PPO | Attending: Emergency Medicine | Admitting: Emergency Medicine

## 2016-03-10 DIAGNOSIS — Z3A15 15 weeks gestation of pregnancy: Secondary | ICD-10-CM | POA: Insufficient documentation

## 2016-03-10 DIAGNOSIS — O99355 Diseases of the nervous system complicating the puerperium: Secondary | ICD-10-CM | POA: Diagnosis present

## 2016-03-10 DIAGNOSIS — G43809 Other migraine, not intractable, without status migrainosus: Secondary | ICD-10-CM

## 2016-03-10 DIAGNOSIS — M62830 Muscle spasm of back: Secondary | ICD-10-CM | POA: Insufficient documentation

## 2016-03-10 DIAGNOSIS — Z79899 Other long term (current) drug therapy: Secondary | ICD-10-CM | POA: Diagnosis not present

## 2016-03-10 DIAGNOSIS — M62838 Other muscle spasm: Secondary | ICD-10-CM

## 2016-03-10 MED ORDER — CYCLOBENZAPRINE HCL 10 MG PO TABS: 10.0000 mg | ORAL_TABLET | Freq: Three times a day (TID) | ORAL | 0 refills | Status: DC | PRN

## 2016-03-10 MED ORDER — METOCLOPRAMIDE HCL 5 MG/ML IJ SOLN
10.0000 mg | Freq: Once | INTRAMUSCULAR | Status: AC
  Administered 2016-03-10: 10 mg via INTRAVENOUS
  Filled 2016-03-10: qty 2

## 2016-03-10 MED ORDER — SODIUM CHLORIDE 0.9 % IV BOLUS (SEPSIS)
1000.0000 mL | Freq: Once | INTRAVENOUS | Status: AC
  Administered 2016-03-10: 1000 mL via INTRAVENOUS

## 2016-03-10 MED ORDER — ACETAMINOPHEN 325 MG PO TABS
650.0000 mg | ORAL_TABLET | Freq: Once | ORAL | Status: AC
  Administered 2016-03-10: 650 mg via ORAL
  Filled 2016-03-10: qty 2

## 2016-03-10 MED ORDER — DIPHENHYDRAMINE HCL 50 MG/ML IJ SOLN
25.0000 mg | Freq: Once | INTRAMUSCULAR | Status: AC
  Administered 2016-03-10: 25 mg via INTRAVENOUS
  Filled 2016-03-10: qty 1

## 2016-03-10 MED ORDER — CYCLOBENZAPRINE HCL 10 MG PO TABS
10.0000 mg | ORAL_TABLET | Freq: Once | ORAL | Status: AC
  Administered 2016-03-10: 10 mg via ORAL
  Filled 2016-03-10: qty 1

## 2016-03-10 MED ORDER — METOCLOPRAMIDE HCL 10 MG PO TABS: 10.0000 mg | ORAL_TABLET | Freq: Three times a day (TID) | ORAL | 0 refills | Status: DC | PRN

## 2016-03-10 NOTE — ED Triage Notes (Signed)
Pt c/o severe headache, right sided behind the eye without change in vision. Denies N/V/D, [redacted] weeks pregnant, advised by women's on call center to be evaluated in 4 hrs. Remarks on hx migraines.

## 2016-03-10 NOTE — ED Provider Notes (Signed)
[facility]-EMERGENCY DEPT Provider Note   CSN: [id number] Arrival date & time: 03/10/16  1800     History   Chief Complaint Chief Complaint  Patient presents with  . Headache  . [redacted] Wks Pregnant    HPI [name] is a 30 y.o. female.  HPI 30 yo F with PMHx of chronic migraines here with headache. Pt currently [redacted] weeks GA. She states that overall, her first trimester of pregnancy was uneventful. Over the past 2 weeks, however, she developed gradual onset of initially mild, aching, throbbing right sided HA. HA feels similar in distribution to her migraines but has not improved with tylenol. The headache feels behind her eye then radiates to her right neck, but denies any neck stiffness. No fevers. No photophobia or phonophobia. No recent head trauma. No swelling of her legs or h/o DVT. No sinus congestion, runny nose, fever, or sore throat.  Past Medical History:  Diagnosis Date  . Migraine headache     Patient Active Problem List   Diagnosis Date Noted  . MITRAL REGURGITATION 03/16/2008  . UNSPECIFIED HYPOTENSION 03/16/2008  . PALPITATIONS, OCCASIONAL 03/16/2008    Past Surgical History:  Procedure Laterality Date  . CESAREAN SECTION      OB History    No data available       Home Medications    Prior to Admission medications   Medication Sig Start Date End Date Taking? Authorizing Provider  acetaminophen (TYLENOL) 500 MG tablet Take 500 mg by mouth every 6 (six) hours as needed for headache.   Yes Historical Provider, MD  Prenatal Vit-Fe Fumarate-FA (PRENATAL MULTIVITAMIN) TABS tablet Take 1 tablet by mouth daily at 12 noon.   Yes Historical Provider, MD  cyclobenzaprine (FLEXERIL) 10 MG tablet Take 1 tablet (10 mg total) by mouth 3 (three) times daily as needed for muscle spasms. 03/10/16   Shaune Pollackameron Sutton Hirsch, MD  metoCLOPramide (REGLAN) 10 MG tablet Take 1 tablet (10 mg total) by mouth every 8 (eight) hours as needed (headache). 03/10/16   Shaune Pollackameron Tyffani Foglesong, MD     Family History History reviewed. No pertinent family history.  Social History Social History  Substance Use Topics  . Smoking status: Never Smoker  . Smokeless tobacco: Not on file  . Alcohol use No     Allergies   Patient has no known allergies.   Review of Systems Review of Systems  Constitutional: Positive for fatigue. Negative for chills and fever.  HENT: Negative for congestion, rhinorrhea and sore throat.   Eyes: Negative for visual disturbance.  Respiratory: Negative for cough, shortness of breath and wheezing.   Cardiovascular: Negative for chest pain and leg swelling.  Gastrointestinal: Negative for abdominal pain, diarrhea, nausea and vomiting.  Genitourinary: Negative for dysuria, flank pain, vaginal bleeding and vaginal discharge.  Musculoskeletal: Negative for neck pain.  Skin: Negative for rash.  Allergic/Immunologic: Negative for immunocompromised state.  Neurological: Positive for headaches. Negative for syncope.  Hematological: Does not bruise/bleed easily.  All other systems reviewed and are negative.    Physical Exam Updated Vital Signs BP 110/65   Pulse 72   Temp 98 F (36.7 C) (Oral)   Resp 14   SpO2 (!) 88%   Physical Exam  Constitutional: She is oriented to person, place, and time. She appears well-developed and well-nourished. No distress.  HENT:  Head: Normocephalic and atraumatic.  Eyes: Conjunctivae are normal.  Neck: Neck supple.  Moderate TTP to right SCM and trapezius. No deformity or step off. No redness.  Cardiovascular: Normal rate, regular rhythm and normal heart sounds.  Exam reveals no friction rub.   No murmur heard. Pulmonary/Chest: Effort normal and breath sounds normal. No respiratory distress. She has no wheezes. She has no rales.  Abdominal: She exhibits no distension.  Musculoskeletal: She exhibits no edema.  Neurological: She is alert and oriented to person, place, and time. She exhibits normal muscle tone.   Skin: Skin is warm. Capillary refill takes less than 2 seconds.  Psychiatric: She has a normal mood and affect.  Nursing note and vitals reviewed.   Neurological Exam:  Mental Status: Alert and oriented to person, place, and time. Attention and concentration normal. Speech clear. Recent memory is intact. Cranial Nerves: Visual fields intact to confrontation in all quadrants bilaterally. EOMI and PERRLA. No nystagmus noted. Facial sensation intact at forehead, maxillary cheek, and chin/mandible bilaterally. No weakness of masticatory muscles. No facial asymmetry or weakness. Hearing grossly normal to finer rub. Uvula is midline, and palate elevates symmetrically. Normal SCM and trapezius strength. Tongue midline without fasciculations Motor: Muscle strength 5/5 in proximal and distal UE and LE bilaterally. No pronator drift. Muscle tone normal. Reflexes: 2+ and symmetrical in all four extremities.  Sensation: Intact to light touch in upper and lower extremities distally bilaterally.  Gait: Normal without ataxia. Coordination: Normal FTN bilaterally.    ED Treatments / Results  Labs (all labs ordered are listed, but only abnormal results are displayed) Labs Reviewed - No data to display  EKG  EKG Interpretation None       Radiology No results found.  Procedures Procedures (including critical care time)  Medications Ordered in ED Medications  sodium chloride 0.9 % bolus 1,000 mL (0 mLs Intravenous Stopped 03/10/16 2256)  metoCLOPramide (REGLAN) injection 10 mg (10 mg Intravenous Given 03/10/16 2145)  diphenhydrAMINE (BENADRYL) injection 25 mg (25 mg Intravenous Given 03/10/16 2145)  acetaminophen (TYLENOL) tablet 650 mg (650 mg Oral Given 03/10/16 2145)  cyclobenzaprine (FLEXERIL) tablet 10 mg (10 mg Oral Given 03/10/16 2146)     Initial Impression / Assessment and Plan / ED Course  I have reviewed the triage vital signs and the nursing notes.  Pertinent labs & imaging  results that were available during my care of the patient were reviewed by me and considered in my medical decision making (see chart for details).  Clinical Course     30 yo F with PMHx of chronic migraines here with sharp, throbbing right-sided HA. HA feels similar to migraines but more severe. Gradual onset. On arrival, VSS and WNL. Exam is as above. Neuro exam is non-focal. Suspect migraine headache versus tension headache versus possible muscular spasm as she does have mild trapezius TTP. Will treat with reglan/benadryl, flexeril, and monitor. O/w, no red flag sx. No fever, photophobia, phonophobia or sx of meningitis or encephalitis. No sinus pain, congestion, facial swelling, or sx to suggest sinus disease or sinus thrombosis. No focal neuro deficits to suggest mass/space occupying lesion. HA began gradually and I do not suspect SAH. BP is normal, pt is only [redacted] wk GA and she has no clinical evidence of preex. Will f/u response.  HA resolved with meds as above. Will d/c with reglan/tylenol PRN headache and flexeril. PCP f/u.  Final Clinical Impressions(s) / ED Diagnoses   Final diagnoses:  Other migraine without status migrainosus, not intractable  Muscle spasm    New Prescriptions Discharge Medication List as of 03/10/2016 10:36 PM    START taking these medications   Details  cyclobenzaprine (FLEXERIL) 10 MG tablet Take 1 tablet (10 mg total) by mouth 3 (three) times daily as needed for muscle spasms., Starting Sat 03/10/2016, Print    metoCLOPramide (REGLAN) 10 MG tablet Take 1 tablet (10 mg total) by mouth every 8 (eight) hours as needed (headache)., Starting Sat 03/10/2016, Print         Shaune Pollack, MD 03/10/16 [phone]

## 2016-03-19 NOTE — L&D Delivery Note (Signed)
Delivery Note Pt pushed very well for about an hour for delivery.  At 12:31 AM a viable and healthy female was delivered via  (Presentation:OA;ROT  ).  APGAR: 7,9 ; weight  P.   Placenta status: delivered, intact.  Cord: 3V with the following complications: none.    Anesthesia:  epidural Episiotomy:  none Lacerations:  abrasions Suture Repair: 3.0 vicryl rapide Est. Blood Loss (mL):  200cc  Mom to postpartum.  Baby to Couplet care / Skin to Skin.  [name] 08/31/2016, 12:53 AM  Br/RI/Tdap in PNC/A+/Contra ?  Kayla BeersMaimona

## 2016-08-03 LAB — OB RESULTS CONSOLE GBS: GBS: POSITIVE

## 2016-08-15 ENCOUNTER — Other Ambulatory Visit: Payer: Self-pay | Admitting: Obstetrics and Gynecology

## 2016-08-15 DIAGNOSIS — D508 Other iron deficiency anemias: Secondary | ICD-10-CM

## 2016-08-15 DIAGNOSIS — D509 Iron deficiency anemia, unspecified: Secondary | ICD-10-CM | POA: Insufficient documentation

## 2016-08-15 MED ORDER — FERUMOXYTOL INJECTION 510 MG/17 ML: 510.0000 mg | INTRAVENOUS | 0 refills | Status: DC

## 2016-08-15 NOTE — Progress Notes (Unsigned)
Pt with IDA [redacted] weeks pregnant for iron infusion, d/w Dr Juliette AlcideShaddad.  Feraheme 510mg  q wk x 2 doses

## 2016-08-16 ENCOUNTER — Encounter (HOSPITAL_COMMUNITY): Payer: Self-pay | Admitting: *Deleted

## 2016-08-16 ENCOUNTER — Inpatient Hospital Stay (HOSPITAL_COMMUNITY)
Admission: AD | Admit: 2016-08-16 | Discharge: 2016-08-16 | Disposition: A | Discharge status: Home / Self Care | Payer: BC Managed Care – PPO | Source: Ambulatory Visit | Attending: Obstetrics and Gynecology | Admitting: Obstetrics and Gynecology

## 2016-08-16 DIAGNOSIS — Z3A38 38 weeks gestation of pregnancy: Secondary | ICD-10-CM | POA: Diagnosis not present

## 2016-08-16 DIAGNOSIS — Z3493 Encounter for supervision of normal pregnancy, unspecified, third trimester: Secondary | ICD-10-CM | POA: Insufficient documentation

## 2016-08-16 HISTORY — DX: Anemia, unspecified: D64.9

## 2016-08-16 MED ORDER — SODIUM CHLORIDE 0.9 % IV SOLN
510.0000 mg | Freq: Once | INTRAVENOUS | Status: AC
  Administered 2016-08-16: 510 mg via INTRAVENOUS
  Filled 2016-08-16: qty 17

## 2016-08-16 NOTE — MAU Provider Note (Signed)
Kayla CrewsSamantha L Anderson [facility]. Patient reports normal fetal movement. She denies regular contractions, vaginal bleeding or LOF.   BP 101/62 (BP Location: Right Arm)   Pulse 99   Temp 97.7 F (36.5 C) (Oral)   Resp 16   SpO2 99%  Physical Exam  Constitutional: She is oriented to person, place, and time. She appears well-developed and well-nourished. No distress.  Cardiovascular: Normal rate.   Pulmonary/Chest: Effort normal.  Neurological: She is alert and oriented to person, place, and time.  Skin: Skin is warm and dry.  Psychiatric: She has a normal mood and affect.   Fetal Monitoring: Baseline: 130 bpm Variability: moderate Accelerations: 15 x 15 Decelerations: none Contractions: irregular  Medical screening exam complete.   Marny LowensteinWenzel, Julie N, PA-C 08/16/2016 9:44 AM

## 2016-08-16 NOTE — MAU Note (Signed)
Sent in today for first iron infusion.  Thinks HGB has been around 8.  No complaints today

## 2016-08-23 ENCOUNTER — Encounter (HOSPITAL_COMMUNITY): Payer: Self-pay | Admitting: *Deleted

## 2016-08-23 ENCOUNTER — Inpatient Hospital Stay (HOSPITAL_COMMUNITY)
Admission: AD | Admit: 2016-08-23 | Discharge: 2016-08-23 | Disposition: A | Discharge status: Home / Self Care | Payer: BC Managed Care – PPO | Source: Ambulatory Visit | Attending: Obstetrics and Gynecology | Admitting: Obstetrics and Gynecology

## 2016-08-23 DIAGNOSIS — D649 Anemia, unspecified: Secondary | ICD-10-CM | POA: Diagnosis present

## 2016-08-23 DIAGNOSIS — O99013 Anemia complicating pregnancy, third trimester: Secondary | ICD-10-CM

## 2016-08-23 MED ORDER — SODIUM CHLORIDE 0.9 % IV SOLN
510.0000 mg | Freq: Once | INTRAVENOUS | Status: AC
  Administered 2016-08-23: 510 mg via INTRAVENOUS
  Filled 2016-08-23: qty 17

## 2016-08-23 MED ORDER — SODIUM CHLORIDE 0.9 % IV SOLN
INTRAVENOUS | Status: DC
  Administered 2016-08-23: 09:00:00 via INTRAVENOUS

## 2016-08-23 NOTE — MAU Provider Note (Signed)
Patient here for IV feraheme infusion. No complaints today. Reactive NST. MSE complete. Patient has appt in office tomorrow.   Judeth HornLawrence, Blythe Hartshorn, NP

## 2016-08-23 NOTE — Discharge Instructions (Signed)
Anemia, Nonspecific Anemia is a condition in which the concentration of red blood cells or hemoglobin in the blood is below normal. Hemoglobin is a substance in red blood cells that carries oxygen to the tissues of the body. Anemia results in not enough oxygen reaching these tissues. What are the causes? Common causes of anemia include:  Excessive bleeding. Bleeding may be internal or external. This includes excessive bleeding from periods (in women) or from the intestine.  Poor nutrition.  Chronic kidney, thyroid, and liver disease.  Bone marrow disorders that decrease red blood cell production.  Cancer and treatments for cancer.  HIV, AIDS, and their treatments.  Spleen problems that increase red blood cell destruction.  Blood disorders.  Excess destruction of red blood cells due to infection, medicines, and autoimmune disorders. What are the signs or symptoms?  Minor weakness.  Dizziness.  Headache.  Palpitations.  Shortness of breath, especially with exercise.  Paleness.  Cold sensitivity.  Indigestion.  Nausea.  Difficulty sleeping.  Difficulty concentrating. Symptoms may occur suddenly or they may develop slowly. How is this diagnosed? Additional blood tests are often needed. These help your health care provider determine the best treatment. Your health care provider will check your stool for blood and look for other causes of blood loss. How is this treated? Treatment varies depending on the cause of the anemia. Treatment can include:  Supplements of iron, vitamin B12, or folic acid.  Hormone medicines.  A blood transfusion. This may be needed if blood loss is severe.  Hospitalization. This may be needed if there is significant continual blood loss.  Dietary changes.  Spleen removal. Follow these instructions at home: Keep all follow-up appointments. It often takes many weeks to correct anemia, and having your health care provider check on your  condition and your response to treatment is very important. Get help right away if:  You develop extreme weakness, shortness of breath, or chest pain.  You become dizzy or have trouble concentrating.  You develop heavy vaginal bleeding.  You develop a rash.  You have bloody or black, tarry stools.  You faint.  You vomit up blood.  You vomit repeatedly.  You have abdominal pain.  You have a fever or persistent symptoms for more than 2-3 days.  You have a fever and your symptoms suddenly get worse.  You are dehydrated. This information is not intended to replace advice given to you by your health care provider. Make sure you discuss any questions you have with your health care provider. Document Released: 04/12/2004 Document Revised: 08/17/2015 Document Reviewed: 08/29/2012 Elsevier Interactive Patient Education  2017 [employer]  

## 2016-08-27 ENCOUNTER — Telehealth (HOSPITAL_COMMUNITY): Payer: Self-pay | Admitting: *Deleted

## 2016-08-27 ENCOUNTER — Encounter (HOSPITAL_COMMUNITY): Payer: Self-pay | Admitting: *Deleted

## 2016-08-27 NOTE — Telephone Encounter (Signed)
Preadmission screen  

## 2016-08-28 ENCOUNTER — Encounter (HOSPITAL_COMMUNITY): Payer: Self-pay | Admitting: *Deleted

## 2016-08-28 ENCOUNTER — Telehealth (HOSPITAL_COMMUNITY): Payer: Self-pay | Admitting: *Deleted

## 2016-08-28 NOTE — Telephone Encounter (Signed)
Preadmission screen  

## 2016-08-29 DIAGNOSIS — O34219 Maternal care for unspecified type scar from previous cesarean delivery: Secondary | ICD-10-CM

## 2016-08-29 NOTE — H&P (Signed)
[name] is a 31 y.o. female [id number] at 39+ for IOL, favorable cervix and term.  Pt has h/o 2 successful vaginal deliveries and LTCS for prolapsed cord.  D/W pt r/b/a of TOLAC, Pregnancy dated by LMP cw early [place].  Received Tdap and Flu in pNC.  Pregnancy complicated by migraine, especiallyoptical (blurry vision/scotomata, etc - eval by eye MD)Also with anemia, s/p Iron transfusion x 2.  [place] for S>D confirmed well grown infant.    OB History    Gravida Para Term Preterm AB Living   4 3 2 1  0 3   SAB TAB Ectopic Multiple Live Births     0     3      Obstetric Comments   3rd preg PPROM, cord "dropped" a wk later- c/s    G1 SAB G2 female 7#14, female SVD G3 female 8#8 female, SVD G58 35 wk female - PPROM, prolapsed cord - LTCS G5 present  + abn pap, f/u nl.  6/16 WNL No STD  Past Medical History:  Diagnosis Date  . Anemia   . Migraine headache    Past Surgical History:  Procedure Laterality Date  . CESAREAN SECTION     Family History: family history includes Cancer in her maternal grandmother and paternal grandmother; Heart disease in her paternal grandfather; Hypertension in her father. DM Social History:  reports that she has never smoked. She has never used smokeless tobacco. She reports that she does not drink alcohol or use drugs. teacher, married Meds PNV, iron, s/p iron infusion All NKDA     Maternal Diabetes: No Genetic Screening: Normal Maternal Ultrasounds/Referrals: Normal Fetal Ultrasounds or other Referrals:  None Maternal Substance Abuse:  No Significant Maternal Medications:  Meds include: Other: iron infusion Significant Maternal Lab Results:  Lab values include: Group B Strep positive Other Comments:  None  Review of Systems  Constitutional: Negative.   HENT: Negative.   Eyes: Negative.   Respiratory: Negative.   Cardiovascular: Negative.   Gastrointestinal: Negative.   Genitourinary: Negative.   Musculoskeletal: Positive for back pain.  Skin:  Negative.   Neurological: Negative.   Psychiatric/Behavioral: Negative.    Maternal Medical History:  Contractions: Frequency: irregular.    Fetal activity: Perceived fetal activity is normal.    Prenatal Complications - Diabetes: none.      There were no vitals taken for this visit. Maternal Exam:  Uterine Assessment: Contraction strength is moderate.  Contraction frequency is irregular.   Abdomen: Patient reports no abdominal tenderness. Surgical scars: low transverse.   Fundal height is appropriate for gestation.   Estimated fetal weight is 7.5-8.5#.   Fetal presentation: vertex  Introitus: Normal vulva. Normal vagina.    Physical Exam  Constitutional: She is oriented to person, place, and time. She appears well-developed and well-nourished.  HENT:  Head: Normocephalic and atraumatic.  Cardiovascular: Normal rate and regular rhythm.   Respiratory: Effort normal and breath sounds normal. No respiratory distress. She has no wheezes.  GI: Soft. Bowel sounds are normal. There is no tenderness.  Musculoskeletal: Normal range of motion.  Neurological: She is alert and oriented to person, place, and time.  Skin: Skin is warm and dry.  Psychiatric: She has a normal mood and affect. Her behavior is normal.    Prenatal labs: ABO, Rh: A/Positive/-- (12/01 0000) Antibody: Negative (12/01 0000) Rubella: Immune (12/01 0000) RPR: Nonreactive (12/01 0000)  HBsAg: Negative (12/01 0000)  HIV: Non-reactive (12/01 0000)  GBS: Positive (05/18 0000)  Hgb 11. -  decreased to 8.7/Plt278/Ur Cx positive/GC neg/Chl neg/Varicella immune/Nl firat trimester screen - nl NT/glucola 111/anat US nl anat, ant plac, female.  Growth check at 80% when S>D  Flu 2/23, Tdap 3/22 Assessment/Plan: 31yo [id number] at 39+ for IOL/TOLAC GBBS + - will treat with PCN for prophylaxis, ROM 4hr after PCN Expect SVD  Possible augmentation with pitocin D/ W pt r/b/a of IOL/TOLAC  Yuvraj Pfeifer Bovard-Stuckert 08/29/2016,  9:39 PM

## 2016-08-30 ENCOUNTER — Inpatient Hospital Stay (HOSPITAL_COMMUNITY): Payer: BC Managed Care – PPO | Admitting: Anesthesiology

## 2016-08-30 ENCOUNTER — Encounter (HOSPITAL_COMMUNITY): Payer: Self-pay

## 2016-08-30 ENCOUNTER — Inpatient Hospital Stay (HOSPITAL_COMMUNITY)
Admission: RE | Admit: 2016-08-30 | Discharge: 2016-09-01 | DRG: 775 | Disposition: A | Discharge status: Home / Self Care | Payer: BC Managed Care – PPO | Source: Ambulatory Visit | Attending: Obstetrics and Gynecology | Admitting: Obstetrics and Gynecology

## 2016-08-30 DIAGNOSIS — O34211 Maternal care for low transverse scar from previous cesarean delivery: Principal | ICD-10-CM | POA: Diagnosis present

## 2016-08-30 DIAGNOSIS — Z3493 Encounter for supervision of normal pregnancy, unspecified, third trimester: Secondary | ICD-10-CM | POA: Diagnosis present

## 2016-08-30 DIAGNOSIS — D649 Anemia, unspecified: Secondary | ICD-10-CM | POA: Diagnosis present

## 2016-08-30 DIAGNOSIS — Z3A39 39 weeks gestation of pregnancy: Secondary | ICD-10-CM

## 2016-08-30 DIAGNOSIS — O9902 Anemia complicating childbirth: Secondary | ICD-10-CM | POA: Diagnosis present

## 2016-08-30 DIAGNOSIS — O34219 Maternal care for unspecified type scar from previous cesarean delivery: Secondary | ICD-10-CM

## 2016-08-30 DIAGNOSIS — O99824 Streptococcus B carrier state complicating childbirth: Secondary | ICD-10-CM | POA: Diagnosis present

## 2016-08-30 LAB — CBC
HCT: 31.9 % — ABNORMAL LOW (ref 36.0–46.0)
Hemoglobin: 10.8 g/dL — ABNORMAL LOW (ref 12.0–15.0)
MCH: 30.3 pg (ref 26.0–34.0)
MCHC: 33.9 g/dL (ref 30.0–36.0)
MCV: 89.6 fL (ref 78.0–100.0)
PLATELETS: 227 10*3/uL (ref 150–400)
RBC: 3.56 MIL/uL — ABNORMAL LOW (ref 3.87–5.11)
RDW: 15.8 % — ABNORMAL HIGH (ref 11.5–15.5)
WBC: 8.4 10*3/uL (ref 4.0–10.5)

## 2016-08-30 LAB — TYPE AND SCREEN
ABO/RH(D): A POS
Antibody Screen: NEGATIVE

## 2016-08-30 LAB — RPR: RPR: NONREACTIVE

## 2016-08-30 MED ORDER — LACTATED RINGERS IV SOLN: 500.0000 mL | Freq: Once | INTRAVENOUS | Status: DC

## 2016-08-30 MED ORDER — BUTORPHANOL TARTRATE 1 MG/ML IJ SOLN: 1.0000 mg | INTRAMUSCULAR | Status: DC | PRN

## 2016-08-30 MED ORDER — EPHEDRINE 5 MG/ML INJ
10.0000 mg | INTRAVENOUS | Status: DC | PRN
  Filled 2016-08-30: qty 2

## 2016-08-30 MED ORDER — DIPHENHYDRAMINE HCL 50 MG/ML IJ SOLN: 12.5000 mg | INTRAMUSCULAR | Status: DC | PRN

## 2016-08-30 MED ORDER — TERBUTALINE SULFATE 1 MG/ML IJ SOLN
0.2500 mg | Freq: Once | INTRAMUSCULAR | Status: DC | PRN
  Filled 2016-08-30: qty 1

## 2016-08-30 MED ORDER — FENTANYL 2.5 MCG/ML BUPIVACAINE 1/10 % EPIDURAL INFUSION (WH - ANES)
14.0000 mL/h | INTRAMUSCULAR | Status: DC | PRN
  Administered 2016-08-30 (×2): 14 mL/h via EPIDURAL
  Filled 2016-08-30 (×3): qty 100

## 2016-08-30 MED ORDER — OXYCODONE-ACETAMINOPHEN 5-325 MG PO TABS: 1.0000 | ORAL_TABLET | ORAL | Status: DC | PRN

## 2016-08-30 MED ORDER — LIDOCAINE HCL (PF) 1 % IJ SOLN
30.0000 mL | INTRAMUSCULAR | Status: DC | PRN
  Filled 2016-08-30: qty 30

## 2016-08-30 MED ORDER — PHENYLEPHRINE 40 MCG/ML (10ML) SYRINGE FOR IV PUSH (FOR BLOOD PRESSURE SUPPORT)
80.0000 ug | PREFILLED_SYRINGE | INTRAVENOUS | Status: DC | PRN
  Administered 2016-08-30: 80 ug via INTRAVENOUS
  Filled 2016-08-30: qty 10
  Filled 2016-08-30: qty 5

## 2016-08-30 MED ORDER — OXYTOCIN 40 UNITS IN LACTATED RINGERS INFUSION - SIMPLE MED: 1.0000 m[IU]/min | INTRAVENOUS | Status: DC

## 2016-08-30 MED ORDER — OXYCODONE-ACETAMINOPHEN 5-325 MG PO TABS: 2.0000 | ORAL_TABLET | ORAL | Status: DC | PRN

## 2016-08-30 MED ORDER — OXYTOCIN 40 UNITS IN LACTATED RINGERS INFUSION - SIMPLE MED
1.0000 m[IU]/min | INTRAVENOUS | Status: DC
  Administered 2016-08-30: 2 m[IU]/min via INTRAVENOUS
  Filled 2016-08-30: qty 1000

## 2016-08-30 MED ORDER — ACETAMINOPHEN 325 MG PO TABS: 650.0000 mg | ORAL_TABLET | ORAL | Status: DC | PRN

## 2016-08-30 MED ORDER — EPHEDRINE 5 MG/ML INJ: 10.0000 mg | INTRAVENOUS | Status: DC | PRN

## 2016-08-30 MED ORDER — LACTATED RINGERS IV SOLN: 500.0000 mL | INTRAVENOUS | Status: DC | PRN

## 2016-08-30 MED ORDER — ONDANSETRON HCL 4 MG/2ML IJ SOLN
4.0000 mg | Freq: Four times a day (QID) | INTRAMUSCULAR | Status: DC | PRN
  Administered 2016-08-30 (×2): 4 mg via INTRAVENOUS
  Filled 2016-08-30 (×2): qty 2

## 2016-08-30 MED ORDER — OXYTOCIN 40 UNITS IN LACTATED RINGERS INFUSION - SIMPLE MED: 2.5000 [IU]/h | INTRAVENOUS | Status: DC

## 2016-08-30 MED ORDER — PHENYLEPHRINE 40 MCG/ML (10ML) SYRINGE FOR IV PUSH (FOR BLOOD PRESSURE SUPPORT): 80.0000 ug | PREFILLED_SYRINGE | INTRAVENOUS | Status: DC | PRN

## 2016-08-30 MED ORDER — PENICILLIN G POTASSIUM 5000000 UNITS IJ SOLR
5.0000 10*6.[IU] | Freq: Once | INTRAVENOUS | Status: AC
  Administered 2016-08-30: 5 10*6.[IU] via INTRAVENOUS
  Filled 2016-08-30: qty 5

## 2016-08-30 MED ORDER — OXYTOCIN BOLUS FROM INFUSION: 500.0000 mL | Freq: Once | INTRAVENOUS | Status: DC

## 2016-08-30 MED ORDER — PENICILLIN G POT IN DEXTROSE 60000 UNIT/ML IV SOLN
3.0000 10*6.[IU] | INTRAVENOUS | Status: DC
  Administered 2016-08-30 (×3): 3 10*6.[IU] via INTRAVENOUS
  Filled 2016-08-30 (×7): qty 50

## 2016-08-30 MED ORDER — SOD CITRATE-CITRIC ACID 500-334 MG/5ML PO SOLN: 30.0000 mL | ORAL | Status: DC | PRN

## 2016-08-30 MED ORDER — PHENYLEPHRINE 40 MCG/ML (10ML) SYRINGE FOR IV PUSH (FOR BLOOD PRESSURE SUPPORT)
80.0000 ug | PREFILLED_SYRINGE | INTRAVENOUS | Status: AC | PRN
  Administered 2016-08-30 (×3): 80 ug via INTRAVENOUS

## 2016-08-30 MED ORDER — LACTATED RINGERS IV SOLN
INTRAVENOUS | Status: DC
  Administered 2016-08-30: 08:00:00 via INTRAVENOUS

## 2016-08-30 MED ORDER — LIDOCAINE HCL (PF) 1 % IJ SOLN
INTRAMUSCULAR | Status: DC | PRN
  Administered 2016-08-30 (×2): 6 mL via EPIDURAL

## 2016-08-30 NOTE — Anesthesia Pain Management Evaluation Note (Signed)
  CRNA Pain Management Visit Note  Patient: Kayla CrewsSamantha L Kulakowski, 31 y.o., female  "Hello I am a member of the anesthesia team at [facility]. We have an anesthesia team available at all times to provide care throughout the hospital, including epidural management and anesthesia for C-section. I don't know your plan for the delivery whether it a natural birth, water birth, IV sedation, nitrous supplementation, doula or epidural, but we want to meet your pain goals."   1.Was your pain managed to your expectations on prior hospitalizations?   Yes   2.What is your expectation for pain management during this hospitalization?     Epidural  3.How can we help you reach that goal? unsure  Record the patient's initial score and the patient's pain goal.   Pain: 3  Pain Goal: 7 [facility] wants you to be able to say your pain was always managed very well.  Cephus ShellingBURGER,Mitchell Iwanicki 08/30/2016

## 2016-08-30 NOTE — Anesthesia Procedure Notes (Signed)
Epidural Patient location during procedure: OB Start time: 08/30/2016 12:05 PM End time: 08/30/2016 12:26 PM  Staffing Anesthesiologist: Jairo BenJACKSON, Dorrell Mitcheltree Performed: anesthesiologist   Preanesthetic Checklist Completed: patient identified, surgical consent, pre-op evaluation, timeout performed, IV checked, risks and benefits discussed and monitors and equipment checked  Epidural Patient position: sitting Prep: site prepped and draped and DuraPrep Patient monitoring: blood pressure, continuous pulse ox, cardiac monitor and heart rate Approach: midline Location: L3-L4 Injection technique: LOR air  Needle:  Needle type: Tuohy  Needle gauge: 17 G Needle length: 9 cm Needle insertion depth: 5 cm Catheter type: closed end flexible Catheter at skin depth: 10 cm Test dose: negative (1% lidocaine)  Assessment Events: blood not aspirated, injection not painful, no injection resistance, negative IV test and no paresthesia  Additional Notes Pt identified in Labor room.  Monitors applied. Working IV access confirmed. Sterile prep, drape lumbar spine.  1% lido local L 3,4.  #17ga Touhy LOR air at 5 cm L 3,4, cath in easily to 10 cm skin. Test dose OK, cath dosed and infusion begun.  Patient asymptomatic, VSS, no heme aspirated, tolerated well.  [name], MDReason for block:procedure for pain

## 2016-08-30 NOTE — Progress Notes (Signed)
Patient ID: Kayla CrewsSamantha L Keng, female   DOB: 12/13/1985, 31 y.o.   MRN: [id number]   Reviewed H&P, no changes Also reviewed TOLAC  AFVSS  gen NAD FHTs 140's, mod var, + accels, category 1 irr ctx bs US confirms  Will await ROM for PCN for gbbs prophylaxis Expect SVD Will start and increase pitocin slowly

## 2016-08-30 NOTE — Anesthesia Preprocedure Evaluation (Signed)
Anesthesia Evaluation  Patient identified by MRN, date of birth, ID band Patient awake    Reviewed: Allergy & Precautions, NPO status , Patient's Chart, lab work & pertinent test results  History of Anesthesia Complications Negative for: history of anesthetic complications  Airway Mallampati: II  TM Distance: >3 FB Neck ROM: Full    Dental  (+) Dental Advisory Given   Pulmonary neg pulmonary ROS,    breath sounds clear to auscultation       Cardiovascular negative cardio ROS   Rhythm:Regular Rate:Normal     Neuro/Psych negative neurological ROS     GI/Hepatic Neg liver ROS, GERD  ,  Endo/Other    Renal/GU negative Renal ROS     Musculoskeletal   Abdominal   Peds  Hematology  (+) Blood dyscrasia (Hb 10.8, plt 227k), anemia ,   Anesthesia Other Findings   Reproductive/Obstetrics (+) Pregnancy                             Anesthesia Physical Anesthesia Plan  ASA: II  Anesthesia Plan: Epidural   Post-op Pain Management:    Induction:   PONV Risk Score and Plan:   Airway Management Planned: Natural Airway  Additional Equipment:   Intra-op Plan:   Post-operative Plan:   Informed Consent: I have reviewed the patients History and Physical, chart, labs and discussed the procedure including the risks, benefits and alternatives for the proposed anesthesia with the patient or authorized representative who has indicated his/her understanding and acceptance.   Dental advisory given  Plan Discussed with:   Anesthesia Plan Comments: (Patient identified. Risks/Benefits/Options discussed with patient including but not limited to bleeding, infection, nerve damage, paralysis, failed block, incomplete pain control, headache, blood pressure changes, nausea, vomiting, reactions to medication both or allergic, itching and postpartum back pain. Confirmed with bedside nurse the patient's most  recent platelet count. Confirmed with patient that they are not currently taking any anticoagulation, have any bleeding history or any family history of bleeding disorders. Patient expressed understanding and wished to proceed. All questions were answered. )        Anesthesia Quick Evaluation

## 2016-08-30 NOTE — Progress Notes (Signed)
Patient ID: Kayla CrewsSamantha L Anderson, female   DOB: 08/25/1985, 31 y.o.   MRN: [id number]   No c/o's.  Comfortable with epidural  AFVSS gen NAD FHTs 130's, mod var, ? Earlies, + accels, category 1 toco q 3 min  SVE 7/90/0-+1  IUPC placed w/o diff/comp - will use to adjust pitocin  Expect SVD

## 2016-08-30 NOTE — Progress Notes (Signed)
RN at bedside from 07:41 to 08:21 attempting to adjust and monitor FHR.  Wireless monitors removed at 08:20 and corded monitors placed.  RN assessing.  Will continue to monitor.

## 2016-08-30 NOTE — Progress Notes (Signed)
Patient ID: Kayla CrewsSamantha L Anderson, female   DOB: 03/21/1985, 31 y.o.   MRN: [id number]   Comfortable with epidural  AFVSS gen NAD FHTs 115-130, mod var, + accels, category 1, now.  Had decel with BP decrease with epidural toco Q 4min  AROM - copious clear fluid, w/o diff/comp SVE 5/70/-2  Continue current mgmt, expect SVD

## 2016-08-31 ENCOUNTER — Encounter (HOSPITAL_COMMUNITY): Payer: Self-pay

## 2016-08-31 LAB — CBC
HCT: 27.1 % — ABNORMAL LOW (ref 36.0–46.0)
Hemoglobin: 9.5 g/dL — ABNORMAL LOW (ref 12.0–15.0)
MCH: 31.3 pg (ref 26.0–34.0)
MCHC: 35.1 g/dL (ref 30.0–36.0)
MCV: 89.1 fL (ref 78.0–100.0)
PLATELETS: 195 10*3/uL (ref 150–400)
RBC: 3.04 MIL/uL — ABNORMAL LOW (ref 3.87–5.11)
RDW: 15.6 % — AB (ref 11.5–15.5)
WBC: 20.7 10*3/uL — AB (ref 4.0–10.5)

## 2016-08-31 MED ORDER — COCONUT OIL OIL
1.0000 "application " | TOPICAL_OIL | Status: DC | PRN
  Administered 2016-08-31: 1 via TOPICAL
  Filled 2016-08-31: qty 120

## 2016-08-31 MED ORDER — SIMETHICONE 80 MG PO CHEW: 80.0000 mg | CHEWABLE_TABLET | ORAL | Status: DC | PRN

## 2016-08-31 MED ORDER — LACTATED RINGERS IV SOLN: INTRAVENOUS | Status: DC

## 2016-08-31 MED ORDER — ACETAMINOPHEN 325 MG PO TABS: 650.0000 mg | ORAL_TABLET | ORAL | Status: DC | PRN

## 2016-08-31 MED ORDER — ONDANSETRON HCL 4 MG PO TABS: 4.0000 mg | ORAL_TABLET | ORAL | Status: DC | PRN

## 2016-08-31 MED ORDER — DIPHENHYDRAMINE HCL 25 MG PO CAPS: 25.0000 mg | ORAL_CAPSULE | Freq: Four times a day (QID) | ORAL | Status: DC | PRN

## 2016-08-31 MED ORDER — SENNOSIDES-DOCUSATE SODIUM 8.6-50 MG PO TABS
2.0000 | ORAL_TABLET | ORAL | Status: DC
  Administered 2016-08-31: 2 via ORAL
  Filled 2016-08-31: qty 2

## 2016-08-31 MED ORDER — OXYCODONE HCL 5 MG PO TABS: 10.0000 mg | ORAL_TABLET | ORAL | Status: DC | PRN

## 2016-08-31 MED ORDER — PRENATAL MULTIVITAMIN CH
1.0000 | ORAL_TABLET | Freq: Every day | ORAL | Status: DC
  Administered 2016-08-31: 1 via ORAL
  Filled 2016-08-31: qty 1

## 2016-08-31 MED ORDER — DIBUCAINE 1 % RE OINT: 1.0000 "application " | TOPICAL_OINTMENT | RECTAL | Status: DC | PRN

## 2016-08-31 MED ORDER — ZOLPIDEM TARTRATE 5 MG PO TABS: 5.0000 mg | ORAL_TABLET | Freq: Every evening | ORAL | Status: DC | PRN

## 2016-08-31 MED ORDER — WITCH HAZEL-GLYCERIN EX PADS: 1.0000 "application " | MEDICATED_PAD | CUTANEOUS | Status: DC | PRN

## 2016-08-31 MED ORDER — IBUPROFEN 600 MG PO TABS
600.0000 mg | ORAL_TABLET | Freq: Four times a day (QID) | ORAL | Status: DC
  Administered 2016-08-31 – 2016-09-01 (×6): 600 mg via ORAL
  Filled 2016-08-31 (×6): qty 1

## 2016-08-31 MED ORDER — BENZOCAINE-MENTHOL 20-0.5 % EX AERO
1.0000 "application " | INHALATION_SPRAY | CUTANEOUS | Status: DC | PRN
  Administered 2016-08-31: 1 via TOPICAL
  Filled 2016-08-31: qty 56

## 2016-08-31 MED ORDER — OXYCODONE HCL 5 MG PO TABS: 5.0000 mg | ORAL_TABLET | ORAL | Status: DC | PRN

## 2016-08-31 MED ORDER — ONDANSETRON HCL 4 MG/2ML IJ SOLN
4.0000 mg | INTRAMUSCULAR | Status: DC | PRN
Start: 2016-08-31 — End: 2016-09-01

## 2016-08-31 MED ORDER — CALCIUM CARBONATE ANTACID 500 MG PO CHEW: 1.0000 | CHEWABLE_TABLET | Freq: Three times a day (TID) | ORAL | Status: DC | PRN

## 2016-08-31 NOTE — Progress Notes (Signed)
Patient ID: [name], female   DOB: 02/22/1986, 31 y.o.   MRN: [id number] DOD Doing well Pain controlled and bleeding slowing down To work on nursing today--concerned has no milk, d/w her colostrum and normal breast milk production --lactation to see pt

## 2016-08-31 NOTE — Lactation Note (Signed)
Lactation Consultation Note  Patient Name: Kayla CrewsSamantha L Anderson NFAOZ'HToday's Date: 08/31/2016 Reason for consult: Initial assessment       Maternal Data Formula Feeding for Exclusion: Yes Reason for exclusion: Mother's choice to formula and breast feed on admission Has patient been taught Hand Expression?: Yes Does the patient have breastfeeding experience prior to this delivery?: Yes  Feeding Feeding Type: Breast Fed  LATCH Score/Interventions Latch: Too sleepy or reluctant, no latch achieved, no sucking elicited.  Audible Swallowing: None Intervention(s): Skin to skin;Hand expression (mom has easily expressed colostrum)  Type of Nipple: Flat Intervention(s): Double electric pump  Comfort (Breast/Nipple): Filling, red/small blisters or bruises, mild/mod discomfort  Problem noted: Mild/Moderate discomfort (nipples already red and tender from lataching, applied 24 nipple shiled, babut baby too sleepy to suckle) Interventions (Mild/moderate discomfort):  (nurse to get mom coconut oil for pumping)  Hold (Positioning): Assistance needed to correctly position infant at breast and maintain latch. Intervention(s): Breastfeeding basics reviewed;Support Pillows;Position options;Skin to skin  LATCH Score: 3  Lactation Tools Discussed/Used Tools: Nipple Shields Nipple shield size: 24 Pump Review: Setup, frequency, and cleaning;Milk Storage (hand exp,  21 flanges) Initiated by:: Ashland Osmer, rn, ibclc Date initiated:: 08/31/16   Consult Status Consult Status: Follow-up Date: 09/01/16 Follow-up type: In-patient    Alfred LevinsLee, Olando Willems Anne 08/31/2016, 10:32 AM

## 2016-08-31 NOTE — Lactation Note (Signed)
This note was copied from a baby's chart. Lactation Consultation Note  Patient Name: [name] ZOXWR'UToday's Date: 08/31/2016 Reason for consult: Initial assessment  With this mom and term, LGA baby, now 349 hours old. Mom wanted to pump and bottle feed, until her milk came in. I showed mom she has easily expressed colostrum, and fitted her with a 24 nipple shield, due to sore, flat nipples. Mom is willing to try breast feeding with the shield, and supplement the baby with EBM. I set up DEP, and gave mom 21 flanges to try. Mom's nurse to bring her coconut oil to use with pumping. Mom's nipples already red and tender. The baby was very sleepy, so di not suckle at the breast. Mom was shown how to apply shield, but will need help with her first application. Mom holding baby skin to skin for now, and knows to call for questions/conerns. lactatino services reviewed with mom also.    Maternal Data Formula Feeding for Exclusion: Yes Reason for exclusion: Mother's choice to formula and breast feed on admission Has patient been taught Hand Expression?: Yes Does the patient have breastfeeding experience prior to this delivery?: Yes  Feeding Feeding Type: Breast Fed  LATCH Score/Interventions Latch: Too sleepy or reluctant, no latch achieved, no sucking elicited.  Audible Swallowing: None (easily expressed colostrum) Intervention(s): Skin to skin;Hand expression  Type of Nipple: Flat (24 nipple shield) Intervention(s): Double electric pump  Comfort (Breast/Nipple): Filling, red/small blisters or bruises, mild/mod discomfort (red, tender nipples at 9 hours pp)  Problem noted: Mild/Moderate discomfort  Hold (Positioning): Assistance needed to correctly position infant at breast and maintain latch. Intervention(s): Breastfeeding basics reviewed;Support Pillows;Position options;Skin to skin  LATCH Score: 3  Lactation Tools Discussed/Used Tools: Nipple Shields Nipple shield size: 24 Pump  Review: Setup, frequency, and cleaning;Milk Storage;Other (comment) (hand expression, 21 flanges, 24 nipple shield) Initiated by:: [name] Date initiated:: 08/31/16   Consult Status Consult Status: Follow-up Date: 09/01/16 Follow-up type: In-patient    Alfred LevinsLee, [name] 08/31/2016, 10:40 AM

## 2016-08-31 NOTE — Anesthesia Postprocedure Evaluation (Signed)
Anesthesia Post Note  Patient: [name]  Procedure(s) Performed: * No procedures listed *     Patient location during evaluation: Mother Baby Anesthesia Type: Epidural Level of consciousness: awake and alert, oriented and patient cooperative Pain management: pain level controlled Vital Signs Assessment: post-procedure vital signs reviewed and stable Respiratory status: spontaneous breathing Cardiovascular status: stable Postop Assessment: no headache, epidural receding, patient able to bend at knees and no signs of nausea or vomiting Anesthetic complications: no Comments: Pain score 3.    Last Vitals:  Vitals:   08/31/16 0245 08/31/16 0339  BP: (!) 128/55 (!) 96/54  Pulse: 93 71  Resp:  16  Temp: 36.9 C 36.9 C    Last Pain:  Vitals:   08/31/16 0644  TempSrc:   PainSc: 3    Pain Goal:                 [facility],[name]

## 2016-09-01 MED ORDER — IBUPROFEN 600 MG PO TABS: 600.0000 mg | ORAL_TABLET | Freq: Four times a day (QID) | ORAL | 0 refills | Status: DC

## 2016-09-01 NOTE — Progress Notes (Signed)
Out to car via w/c   Baby secured in car seat by dad

## 2016-09-01 NOTE — Discharge Summary (Signed)
OB Discharge Summary     Patient Name: [name] DOB: 11/23/1985 MRN: [id number]  Date of admission: 08/30/2016 Delivering MD: Sherian ReinBOVARD-STUCKERT, JODY   Date of discharge: 09/01/2016  Admitting diagnosis: INDUCTION Intrauterine pregnancy: 2655w4d     Secondary diagnosis:  Principal Problem:   SVD (spontaneous vaginal delivery) Active Problems:   History of cesarean delivery affecting pregnancy   History of cesarean delivery, antepartum VBAC  Additional problems: none     Discharge diagnosis: Term Pregnancy Delivered                                                                                                Post partum procedures:none  Augmentation: AROM and Pitocin  Complications: None  Hospital course:  Induction of Labor With Vaginal Delivery   31 y.o. yo [id number] at 7255w4d was admitted to the hospital 08/30/2016 for induction of labor.  Indication for induction: Favorable cervix at term.  Patient had an uncomplicated labor course as follows: Membrane Rupture Time/Date: 1:23 PM ,08/30/2016   Intrapartum Procedures: Episiotomy: None [1]                                         Lacerations:  None [1]  Patient had delivery of a Viable infant.  Information for the patient's newborn:  [name], [name] [[id number]][[id number]]  Delivery Method: Vag-Spont   08/31/2016  Details of delivery can be found in separate delivery note.  Patient had a routine postpartum course. Patient is discharged home 09/01/16.  Physical exam  Vitals:   08/31/16 0245 08/31/16 0339 08/31/16 0730 09/01/16 0558  BP: (!) 128/55 (!) 96/54 110/62 (!) 103/59  Pulse: 93 71 80 68  Resp:  16 20 20   Temp: 98.5 F (36.9 C) 98.5 F (36.9 C) 98.6 F (37 C) 97.7 F (36.5 C)  TempSrc: Oral Oral  Oral  SpO2:  100%    Weight:      Height:       General: alert and cooperative Lochia: appropriate Uterine Fundus: firm  Labs: Lab Results  Component Value Date   WBC 20.7 (H) 08/31/2016   HGB 9.5 (L)  08/31/2016   HCT 27.1 (L) 08/31/2016   MCV 89.1 08/31/2016   PLT 195 08/31/2016   CMP Latest Ref Rng & Units 12/29/2009  Glucose 70 - 99 mg/dL 99  BUN 6 - 23 mg/dL 6  Creatinine 0.4 - 1.2 mg/dL 1.30(Q0.31(L)  Sodium 657135 - 846145 mEq/L 134(L)  Potassium 3.5 - 5.1 mEq/L 3.5  Chloride 96 - 112 mEq/L 102  CO2 19 - 32 mEq/L 23  Calcium 8.4 - 10.5 mg/dL 8.7  Total Protein 6.0 - 8.3 g/dL 6.8  Total Bilirubin 0.3 - 1.2 mg/dL 9.6(E0.2(L)  Alkaline Phos 39 - 117 U/L 92  AST 0 - 37 U/L 23  ALT 0 - 35 U/L 10    Discharge instruction: per After Visit Summary and "Baby and Me Booklet".  After visit meds:  Allergies as of 09/01/2016   No Known Allergies  Medication List    TAKE these medications   acetaminophen 500 MG tablet Commonly known as:  TYLENOL Take 500 mg by mouth every 6 (six) hours as needed for headache.   calcium carbonate 500 MG chewable tablet Commonly known as:  TUMS - dosed in mg elemental calcium Chew 1 tablet by mouth 3 (three) times daily as needed for indigestion or heartburn.   ferrous sulfate 325 (65 FE) MG tablet Take 325 mg by mouth every evening.   ibuprofen 600 MG tablet Commonly known as:  ADVIL,MOTRIN Take 1 tablet (600 mg total) by mouth every 6 (six) hours.   prenatal multivitamin Tabs tablet Take 1 tablet by mouth every evening.       Diet: routine diet  Activity: Advance as tolerated. Pelvic rest for 6 weeks.   Outpatient follow up:6 weeks Follow up Appt:No future appointments. Follow up Visit:No Follow-up on file.  Postpartum contraception: Undecided  Newborn Data: Live born female  Birth Weight: 9 lb 6.4 oz (4264 g) APGAR: 7, 9  Baby Feeding: Bottle and Breast Disposition:home with mother   09/01/2016 Oliver Pila, MD

## 2016-09-01 NOTE — Lactation Note (Signed)
This note was copied from a baby's chart. Lactation Consultation Note  Patient Name: [name] ZOXWR'UToday's Date: 09/01/2016 Reason for consult: Follow-up assessment  With this mom of a term, LGA, now 2633 hours old and at 3% weight loss. Mom is doing well with breast feeding, baby with more than adequate wet and dirty diapers. Mom reassured she is doing very well with BF. Mom knows to call for questions/conerns.   Maternal Data    Feeding    LATCH Score/Interventions                      Lactation Tools Discussed/Used     Consult Status Consult Status: Complete Follow-up type: Call as needed    Alfred LevinsLee, Tommy Minichiello Anne 09/01/2016, 10:10 AM

## 2016-09-01 NOTE — Progress Notes (Signed)
Post Partum Day 1 Subjective: no complaints, up ad lib and tolerating PO  Requesting early d/c  Objective: Blood pressure (!) 103/59, pulse 68, temperature 97.7 F (36.5 C), temperature source Oral, resp. rate 20, height 5\' 2"  (1.575 m), weight 90.7 kg (200 lb), SpO2 100 %, unknown if currently breastfeeding.  Physical Exam:  General: alert and cooperative Lochia: appropriate Uterine Fundus: firm   Recent Labs  08/30/16 0825 08/31/16 0517  HGB 10.8* 9.5*  HCT 31.9* 27.1*    Assessment/Plan: Discharge home if baby able to go   LOS: 2 days   Oliver PilaKathy W Anwar Sakata 09/01/2016, 8:03 AM

## 2019-03-25 ENCOUNTER — Other Ambulatory Visit: Payer: Self-pay

## 2019-03-25 ENCOUNTER — Emergency Department (HOSPITAL_COMMUNITY)
Admission: EM | Admit: 2019-03-25 | Discharge: 2019-03-25 | Disposition: A | Discharge status: Home / Self Care | Payer: BC Managed Care – PPO | Attending: Emergency Medicine | Admitting: Emergency Medicine

## 2019-03-25 DIAGNOSIS — R0789 Other chest pain: Secondary | ICD-10-CM | POA: Diagnosis present

## 2019-03-25 DIAGNOSIS — Z5321 Procedure and treatment not carried out due to patient leaving prior to being seen by health care provider: Secondary | ICD-10-CM | POA: Diagnosis not present

## 2019-03-25 NOTE — ED Notes (Signed)
After being revitalized pt sts she does not want to wait. Pt sts she is going home and laying down to see if the pain gets better on its own.

## 2019-03-25 NOTE — ED Triage Notes (Signed)
Patient reports right sided chest pain that started last night; radiates to right arm. Described as constant/dull and tightness. Denies sob, congestion, nausea, cough or palpitations. Seen at walk in clinic earlier today and was told to come to ED for further evaluation.

## 2019-08-05 ENCOUNTER — Ambulatory Visit: Payer: BC Managed Care – PPO

## 2019-08-08 ENCOUNTER — Ambulatory Visit: Payer: BC Managed Care – PPO | Attending: Internal Medicine

## 2019-08-08 DIAGNOSIS — Z23 Encounter for immunization: Secondary | ICD-10-CM

## 2019-08-08 NOTE — Progress Notes (Signed)
   Covid-19 Vaccination Clinic  Name:  [name]    MRN: [id number] DOB: [birth year]  08/08/2019  [name] was observed post Covid-19 immunization for 15 minutes without incident. She was provided with Vaccine Information Sheet and instruction to access the V-Safe system.   [name] was instructed to call 911 with any severe reactions post vaccine: Marland Kitchen Difficulty breathing  . Swelling of face and throat  . A fast heartbeat  . A bad rash all over body  . Dizziness and weakness   Immunizations Administered    Name Date Dose VIS Date Route   Pfizer COVID-19 Vaccine 08/08/2019  8:15 AM 0.3 mL 05/13/2018 Intramuscular   Manufacturer: [employer], [employer]   Lot: [id number]   NDC: [address]-7

## 2019-08-31 ENCOUNTER — Ambulatory Visit: Payer: BC Managed Care – PPO

## 2020-03-21 ENCOUNTER — Other Ambulatory Visit: Payer: Self-pay

## 2020-03-21 DIAGNOSIS — R197 Diarrhea, unspecified: Secondary | ICD-10-CM | POA: Insufficient documentation

## 2020-03-21 DIAGNOSIS — Z5321 Procedure and treatment not carried out due to patient leaving prior to being seen by health care provider: Secondary | ICD-10-CM | POA: Diagnosis not present

## 2020-03-21 DIAGNOSIS — R109 Unspecified abdominal pain: Secondary | ICD-10-CM | POA: Insufficient documentation

## 2020-03-22 ENCOUNTER — Emergency Department (HOSPITAL_COMMUNITY)
Admission: EM | Admit: 2020-03-22 | Discharge: 2020-03-22 | Disposition: A | Discharge status: Home / Self Care | Payer: BC Managed Care – PPO | Attending: Emergency Medicine | Admitting: Emergency Medicine

## 2020-03-22 ENCOUNTER — Encounter (HOSPITAL_COMMUNITY): Payer: Self-pay | Admitting: Emergency Medicine

## 2020-03-22 LAB — BASIC METABOLIC PANEL
Anion gap: 13 (ref 5–15)
BUN: 10 mg/dL (ref 6–20)
CO2: 24 mmol/L (ref 22–32)
Calcium: 9.7 mg/dL (ref 8.9–10.3)
Chloride: 100 mmol/L (ref 98–111)
Creatinine, Ser: 0.69 mg/dL (ref 0.44–1.00)
GFR, Estimated: >60 mL/min (ref >60)
Glucose, Bld: 125 mg/dL — ABNORMAL HIGH (ref 70–99)
Potassium: 3.8 mmol/L (ref 3.5–5.1)
Sodium: 137 mmol/L (ref 135–145)

## 2020-03-22 LAB — CBC
HCT: 36.7 % (ref 36.0–46.0)
Hemoglobin: 12.4 g/dL (ref 12.0–15.0)
MCH: 29.7 pg (ref 26.0–34.0)
MCHC: 33.8 g/dL (ref 30.0–36.0)
MCV: 87.8 fL (ref 80.0–100.0)
Platelets: 276 10*3/uL (ref 150–400)
RBC: 4.18 MIL/uL (ref 3.87–5.11)
RDW: 12.3 % (ref 11.5–15.5)
WBC: 11.2 10*3/uL — ABNORMAL HIGH (ref 4.0–10.5)
nRBC: 0 % (ref 0.0–0.2)

## 2020-03-22 LAB — I-STAT BETA HCG BLOOD, ED (MC, WL, AP ONLY): I-stat hCG, quantitative: <5 m[IU]/mL (ref <5)

## 2020-03-22 NOTE — ED Triage Notes (Signed)
Pt arriving with complaint of right flank pain x24 hours. Pt reports taking Gas-X at 4pm with no relief. Rating pain 8/10 at this time. Pt denies urinary symptoms. States she had severe diarrhea for approx 12 hours before this pain started.

## 2020-03-22 NOTE — ED Notes (Signed)
Pt returned labels and is leaving

## 2020-10-17 DIAGNOSIS — Z8719 Personal history of other diseases of the digestive system: Secondary | ICD-10-CM

## 2020-10-17 HISTORY — DX: Personal history of other diseases of the digestive system: Z87.19

## 2020-10-17 HISTORY — PX: COLONOSCOPY: SHX174

## 2020-11-25 ENCOUNTER — Ambulatory Visit: Payer: Self-pay | Admitting: General Surgery

## 2021-01-12 NOTE — Progress Notes (Signed)
Surgical Instructions    Your procedure is scheduled on November 3rd, 2022.   Report to [facility] Main Entrance "A" at 06:30 A.M., then check in with the Admitting office.  Call this number if you have problems the morning of surgery:  [phone]   If you have any questions prior to your surgery date call [phone]: Open Monday-Friday 8am-4pm    Remember:  Do not eat after midnight the night before your surgery  You may drink clear liquids until 05:30 the morning of your surgery.   Clear liquids allowed are: Water, Non-Citrus Juices (without pulp), Carbonated Beverages, Clear Tea, Black Coffee ONLY (NO MILK, CREAM OR POWDERED CREAMER of any kind), and Gatorade    Take these medicines the morning of surgery with A SIP OF WATER:   acetaminophen (TYLENOL) - if needed   As of today, STOP taking any Aspirin (unless otherwise instructed by your surgeon) Aleve, Naproxen, Ibuprofen, Motrin, Advil, Goody's, BC's, all herbal medications, fish oil, and all vitamins.     The day of surgery:          Do not wear jewelry or makeup Do not wear lotions, powders, perfumes, or deodorant. Do not shave 48 hours prior to surgery.  Do not bring valuables to the hospital. DO Not wear nail polish, gel polish, artificial nails, or any other type of covering on natural nails including finger and toenails. If patients have artificial nails, gel coating, etc. that need to be removed by a nail salon, please have this removed prior to surgery or surgery may need to be canceled/delayed if the surgeon/ anesthesia feels like the patient is unable to be adequately monitored.               [facility] is not responsible for any belongings or valuables.  Do NOT Smoke (Tobacco/Vaping)  24 hours prior to your procedure  If you use a CPAP at night, you may bring your mask for your overnight stay.   Contacts, glasses, hearing aids, dentures or partials may not be worn into surgery, please bring cases for  these belongings   For patients admitted to the hospital, discharge time will be determined by your treatment team.   Patients discharged the day of surgery will not be allowed to drive home, and someone needs to stay with them for 24 hours.  NO VISITORS WILL BE ALLOWED IN PRE-OP WHERE PATIENTS ARE PREPPED FOR SURGERY.  ONLY 1 SUPPORT PERSON MAY BE PRESENT IN THE WAITING ROOM WHILE YOU ARE IN SURGERY.  IF YOU ARE TO BE ADMITTED, ONCE YOU ARE IN YOUR ROOM YOU WILL BE ALLOWED TWO (2) VISITORS. 1 (ONE) VISITOR MAY STAY OVERNIGHT BUT MUST ARRIVE TO THE ROOM BY 8pm.  Minor children may have two parents present. Special consideration for safety and communication needs will be reviewed on a case by case basis.  Special instructions:    Oral Hygiene is also important to reduce your risk of infection.  Remember - BRUSH YOUR TEETH THE MORNING OF SURGERY WITH YOUR REGULAR TOOTHPASTE   [facility]- Preparing For Surgery  Before surgery, you can play an important role. Because skin is not sterile, your skin needs to be as free of germs as possible. You can reduce the number of germs on your skin by washing with CHG (chlorahexidine gluconate) Soap before surgery.  CHG is an antiseptic cleaner which kills germs and bonds with the skin to continue killing germs even after washing.     Please  do not use if you have an allergy to CHG or antibacterial soaps. If your skin becomes reddened/irritated stop using the CHG.  Do not shave (including legs and underarms) for at least 48 hours prior to first CHG shower. It is OK to shave your face.  Please follow these instructions carefully.     Shower the NIGHT BEFORE SURGERY and the MORNING OF SURGERY with CHG Soap.   If you chose to wash your hair, wash your hair first as usual with your normal shampoo. After you shampoo, rinse your hair and body thoroughly to remove the shampoo.  Then [employer] and genitals (private parts) with your normal soap and rinse thoroughly  to remove soap.  After that Use CHG Soap as you would any other liquid soap. You can apply CHG directly to the skin and wash gently with a scrungie or a clean washcloth.   Apply the CHG Soap to your body ONLY FROM THE NECK DOWN.  Do not use on open wounds or open sores. Avoid contact with your eyes, ears, mouth and genitals (private parts). Wash Face and genitals (private parts)  with your normal soap.   Wash thoroughly, paying special attention to the area where your surgery will be performed.  Thoroughly rinse your body with warm water from the neck down.  DO NOT shower/wash with your normal soap after using and rinsing off the CHG Soap.  Pat yourself dry with a CLEAN TOWEL.  Wear CLEAN PAJAMAS to bed the night before surgery  Place CLEAN SHEETS on your bed the night before your surgery  DO NOT SLEEP WITH PETS.   Day of Surgery:  Take a shower with CHG soap. Wear Clean/Comfortable clothing the morning of surgery Do not apply any deodorants/lotions.   Remember to brush your teeth WITH YOUR REGULAR TOOTHPASTE.   Please read over the following fact sheets that you were given.

## 2021-01-13 ENCOUNTER — Encounter (HOSPITAL_COMMUNITY): Payer: Self-pay | Admitting: *Deleted

## 2021-01-13 ENCOUNTER — Encounter (HOSPITAL_COMMUNITY)
Admission: RE | Admit: 2021-01-13 | Discharge: 2021-01-13 | Disposition: A | Discharge status: Home / Self Care | Payer: BC Managed Care – PPO | Source: Ambulatory Visit | Attending: General Surgery | Admitting: General Surgery

## 2021-01-13 ENCOUNTER — Other Ambulatory Visit: Payer: Self-pay

## 2021-01-13 VITALS — BP 112/71 | HR 76 | Temp 97.8°F | Resp 18 | Ht 62.0 in | Wt 181.5 lb

## 2021-01-13 DIAGNOSIS — Z01812 Encounter for preprocedural laboratory examination: Secondary | ICD-10-CM | POA: Diagnosis present

## 2021-01-13 DIAGNOSIS — Z01818 Encounter for other preprocedural examination: Secondary | ICD-10-CM

## 2021-01-13 LAB — CBC
HCT: 39.4 % (ref 36.0–46.0)
Hemoglobin: 12.9 g/dL (ref 12.0–15.0)
MCH: 29.8 pg (ref 26.0–34.0)
MCHC: 32.7 g/dL (ref 30.0–36.0)
MCV: 91 fL (ref 80.0–100.0)
Platelets: 364 10*3/uL (ref 150–400)
RBC: 4.33 MIL/uL (ref 3.87–5.11)
RDW: 12.1 % (ref 11.5–15.5)
WBC: 9 10*3/uL (ref 4.0–10.5)
nRBC: 0 % (ref 0.0–0.2)

## 2021-01-13 NOTE — Progress Notes (Signed)
PCP - No PCP Cardiologist - Denies  Chest x-ray - Not indicated EKG - Not indicated Stress Test - Denies ECHO - 2008-01-05 (Had passed out twice with pregnancy - no f/u needed) Cardiac Cath - Denies  Sleep Study - Denies  ERAS Protcol -Yes  COVID TEST- Ambulatory surgery   Anesthesia review:  No  Patient denies shortness of breath, fever, cough and chest pain at PAT appointment   All instructions explained to the patient, with a verbal understanding of the material. Patient agrees to go over the instructions while at home for a better understanding. The opportunity to ask questions was provided.

## 2021-01-18 NOTE — Anesthesia Preprocedure Evaluation (Addendum)
Anesthesia Evaluation  Patient identified by MRN, date of birth, ID band Patient awake    Reviewed: Allergy & Precautions, NPO status , Patient's Chart, lab work & pertinent test results  History of Anesthesia Complications Negative for: history of anesthetic complications  Airway Mallampati: II  TM Distance: >3 FB Neck ROM: Full    Dental  (+) Teeth Intact, Dental Advisory Given   Pulmonary neg pulmonary ROS,    Pulmonary exam normal        Cardiovascular Normal cardiovascular exam+ Valvular Problems/Murmurs (mild MR by 2009 echo) MR      Neuro/Psych  Headaches,    GI/Hepatic Neg liver ROS, hiatal hernia,   Endo/Other  negative endocrine ROS  Renal/GU negative Renal ROS  negative genitourinary   Musculoskeletal negative musculoskeletal ROS (+)   Abdominal   Peds  Hematology negative hematology ROS (+)   Anesthesia Other Findings   Reproductive/Obstetrics                            Anesthesia Physical Anesthesia Plan  ASA: 2  Anesthesia Plan: General   Post-op Pain Management:    Induction: Intravenous  PONV Risk Score and Plan: 4 or greater and Ondansetron, Dexamethasone, Treatment may vary due to age or medical condition and Midazolam  Airway Management Planned: Oral ETT  Additional Equipment: None  Intra-op Plan:   Post-operative Plan: Extubation in OR  Informed Consent: I have reviewed the patients History and Physical, chart, labs and discussed the procedure including the risks, benefits and alternatives for the proposed anesthesia with the patient or authorized representative who has indicated his/her understanding and acceptance.     Dental advisory given  Plan Discussed with:   Anesthesia Plan Comments:        Anesthesia Quick Evaluation

## 2021-01-19 ENCOUNTER — Ambulatory Visit (HOSPITAL_COMMUNITY): Payer: BC Managed Care – PPO

## 2021-01-19 ENCOUNTER — Other Ambulatory Visit: Payer: Self-pay

## 2021-01-19 ENCOUNTER — Ambulatory Visit (HOSPITAL_COMMUNITY): Payer: BC Managed Care – PPO | Admitting: Anesthesiology

## 2021-01-19 ENCOUNTER — Encounter (HOSPITAL_COMMUNITY)
Admission: RE | Disposition: A | Discharge status: Home / Self Care | Payer: Self-pay | Source: Home / Self Care | Attending: General Surgery

## 2021-01-19 ENCOUNTER — Encounter (HOSPITAL_COMMUNITY): Payer: Self-pay | Admitting: General Surgery

## 2021-01-19 ENCOUNTER — Ambulatory Visit (HOSPITAL_COMMUNITY)
Admission: RE | Admit: 2021-01-19 | Discharge: 2021-01-19 | Disposition: A | Discharge status: Home / Self Care | Payer: BC Managed Care – PPO | Attending: General Surgery | Admitting: General Surgery

## 2021-01-19 DIAGNOSIS — K8010 Calculus of gallbladder with chronic cholecystitis without obstruction: Secondary | ICD-10-CM | POA: Insufficient documentation

## 2021-01-19 DIAGNOSIS — K8080 Other cholelithiasis without obstruction: Secondary | ICD-10-CM | POA: Diagnosis present

## 2021-01-19 DIAGNOSIS — Z419 Encounter for procedure for purposes other than remedying health state, unspecified: Secondary | ICD-10-CM

## 2021-01-19 HISTORY — PX: CHOLECYSTECTOMY: SHX55

## 2021-01-19 LAB — POCT PREGNANCY, URINE: Preg Test, Ur: NEGATIVE

## 2021-01-19 SURGERY — LAPAROSCOPIC CHOLECYSTECTOMY WITH INTRAOPERATIVE CHOLANGIOGRAM
Anesthesia: General

## 2021-01-19 MED ORDER — OXYCODONE HCL 5 MG PO TABS
5.0000 mg | ORAL_TABLET | Freq: Once | ORAL | Status: AC | PRN
  Administered 2021-01-19: 5 mg via ORAL

## 2021-01-19 MED ORDER — MIDAZOLAM HCL 2 MG/2ML IJ SOLN
INTRAMUSCULAR | Status: AC
  Filled 2021-01-19: qty 2

## 2021-01-19 MED ORDER — PROPOFOL 10 MG/ML IV BOLUS
INTRAVENOUS | Status: DC | PRN
  Administered 2021-01-19: 120 mg via INTRAVENOUS

## 2021-01-19 MED ORDER — CHLORHEXIDINE GLUCONATE CLOTH 2 % EX PADS: 6.0000 | MEDICATED_PAD | Freq: Once | CUTANEOUS | Status: DC

## 2021-01-19 MED ORDER — LIDOCAINE 2% (20 MG/ML) 5 ML SYRINGE
INTRAMUSCULAR | Status: DC | PRN
  Administered 2021-01-19: 60 mg via INTRAVENOUS

## 2021-01-19 MED ORDER — GABAPENTIN 300 MG PO CAPS
300.0000 mg | ORAL_CAPSULE | ORAL | Status: AC
  Administered 2021-01-19: 300 mg via ORAL
  Filled 2021-01-19: qty 1

## 2021-01-19 MED ORDER — DEXAMETHASONE SODIUM PHOSPHATE 10 MG/ML IJ SOLN
INTRAMUSCULAR | Status: DC | PRN
  Administered 2021-01-19: 5 mg via INTRAVENOUS

## 2021-01-19 MED ORDER — LACTATED RINGERS IV SOLN: INTRAVENOUS | Status: DC

## 2021-01-19 MED ORDER — ONDANSETRON HCL 4 MG/2ML IJ SOLN: 4.0000 mg | Freq: Once | INTRAMUSCULAR | Status: DC | PRN

## 2021-01-19 MED ORDER — CHLORHEXIDINE GLUCONATE 0.12 % MT SOLN
15.0000 mL | Freq: Once | OROMUCOSAL | Status: AC
  Administered 2021-01-19: 15 mL via OROMUCOSAL
  Filled 2021-01-19: qty 15

## 2021-01-19 MED ORDER — OXYCODONE HCL 5 MG PO TABS
ORAL_TABLET | ORAL | Status: AC
  Filled 2021-01-19: qty 1

## 2021-01-19 MED ORDER — HYDROCODONE-ACETAMINOPHEN 5-325 MG PO TABS: 1.0000 | ORAL_TABLET | Freq: Four times a day (QID) | ORAL | 0 refills | Status: AC | PRN

## 2021-01-19 MED ORDER — SODIUM CHLORIDE 0.9 % IR SOLN
Status: DC | PRN
  Administered 2021-01-19: 1000 mL

## 2021-01-19 MED ORDER — ROCURONIUM BROMIDE 10 MG/ML (PF) SYRINGE
PREFILLED_SYRINGE | INTRAVENOUS | Status: DC | PRN
  Administered 2021-01-19: 10 mg via INTRAVENOUS
  Administered 2021-01-19: 70 mg via INTRAVENOUS

## 2021-01-19 MED ORDER — DEXAMETHASONE SODIUM PHOSPHATE 10 MG/ML IJ SOLN
INTRAMUSCULAR | Status: AC
  Filled 2021-01-19: qty 1

## 2021-01-19 MED ORDER — FENTANYL CITRATE (PF) 250 MCG/5ML IJ SOLN
INTRAMUSCULAR | Status: DC | PRN
  Administered 2021-01-19 (×5): 50 ug via INTRAVENOUS

## 2021-01-19 MED ORDER — SUGAMMADEX SODIUM 200 MG/2ML IV SOLN
INTRAVENOUS | Status: DC | PRN
Start: 2021-01-19 — End: 2021-01-19
  Administered 2021-01-19: 200 mg via INTRAVENOUS

## 2021-01-19 MED ORDER — FENTANYL CITRATE (PF) 100 MCG/2ML IJ SOLN
25.0000 ug | INTRAMUSCULAR | Status: DC | PRN
  Administered 2021-01-19: 25 ug via INTRAVENOUS
  Filled 2021-01-19: qty 2

## 2021-01-19 MED ORDER — ROCURONIUM BROMIDE 10 MG/ML (PF) SYRINGE
PREFILLED_SYRINGE | INTRAVENOUS | Status: AC
  Filled 2021-01-19: qty 20

## 2021-01-19 MED ORDER — MIDAZOLAM HCL 2 MG/2ML IJ SOLN
INTRAMUSCULAR | Status: DC | PRN
Start: 2021-01-19 — End: 2021-01-19
  Administered 2021-01-19: 2 mg via INTRAVENOUS

## 2021-01-19 MED ORDER — CEFAZOLIN SODIUM-DEXTROSE 2-4 GM/100ML-% IV SOLN
2.0000 g | INTRAVENOUS | Status: AC
  Administered 2021-01-19: 2 g via INTRAVENOUS
  Filled 2021-01-19: qty 100

## 2021-01-19 MED ORDER — ACETAMINOPHEN 500 MG PO TABS
1000.0000 mg | ORAL_TABLET | ORAL | Status: AC
  Administered 2021-01-19: 1000 mg via ORAL
  Filled 2021-01-19: qty 2

## 2021-01-19 MED ORDER — BUPIVACAINE-EPINEPHRINE 0.25% -1:200000 IJ SOLN
INTRAMUSCULAR | Status: DC | PRN
  Administered 2021-01-19: 22 mL

## 2021-01-19 MED ORDER — BUPIVACAINE-EPINEPHRINE (PF) 0.25% -1:200000 IJ SOLN
INTRAMUSCULAR | Status: AC
  Filled 2021-01-19: qty 60

## 2021-01-19 MED ORDER — IOHEXOL 300 MG/ML  SOLN
INTRAMUSCULAR | Status: DC | PRN
  Administered 2021-01-19: 4 mL

## 2021-01-19 MED ORDER — ORAL CARE MOUTH RINSE: 15.0000 mL | Freq: Once | OROMUCOSAL | Status: AC

## 2021-01-19 MED ORDER — FENTANYL CITRATE (PF) 250 MCG/5ML IJ SOLN
INTRAMUSCULAR | Status: AC
  Filled 2021-01-19: qty 5

## 2021-01-19 MED ORDER — ONDANSETRON HCL 4 MG/2ML IJ SOLN
INTRAMUSCULAR | Status: AC
  Filled 2021-01-19: qty 2

## 2021-01-19 MED ORDER — 0.9 % SODIUM CHLORIDE (POUR BTL) OPTIME
TOPICAL | Status: DC | PRN
  Administered 2021-01-19: 1000 mL

## 2021-01-19 MED ORDER — AMISULPRIDE (ANTIEMETIC) 5 MG/2ML IV SOLN
INTRAVENOUS | Status: AC
  Filled 2021-01-19: qty 2

## 2021-01-19 MED ORDER — CELECOXIB 200 MG PO CAPS
200.0000 mg | ORAL_CAPSULE | ORAL | Status: AC
  Administered 2021-01-19: 200 mg via ORAL
  Filled 2021-01-19: qty 1

## 2021-01-19 MED ORDER — LIDOCAINE 2% (20 MG/ML) 5 ML SYRINGE
INTRAMUSCULAR | Status: AC
  Filled 2021-01-19: qty 10

## 2021-01-19 MED ORDER — ONDANSETRON HCL 4 MG/2ML IJ SOLN
INTRAMUSCULAR | Status: DC | PRN
  Administered 2021-01-19: 4 mg via INTRAVENOUS

## 2021-01-19 MED ORDER — OXYCODONE HCL 5 MG/5ML PO SOLN: 5.0000 mg | Freq: Once | ORAL | Status: AC | PRN

## 2021-01-19 MED ORDER — PHENYLEPHRINE 40 MCG/ML (10ML) SYRINGE FOR IV PUSH (FOR BLOOD PRESSURE SUPPORT)
PREFILLED_SYRINGE | INTRAVENOUS | Status: AC
  Filled 2021-01-19: qty 10

## 2021-01-19 MED ORDER — PHENYLEPHRINE 40 MCG/ML (10ML) SYRINGE FOR IV PUSH (FOR BLOOD PRESSURE SUPPORT)
PREFILLED_SYRINGE | INTRAVENOUS | Status: DC | PRN
  Administered 2021-01-19: 120 ug via INTRAVENOUS
  Administered 2021-01-19: 40 ug via INTRAVENOUS

## 2021-01-19 MED ORDER — AMISULPRIDE (ANTIEMETIC) 5 MG/2ML IV SOLN
10.0000 mg | Freq: Once | INTRAVENOUS | Status: AC | PRN
  Administered 2021-01-19: 10 mg via INTRAVENOUS

## 2021-01-19 MED ORDER — PROPOFOL 10 MG/ML IV BOLUS
INTRAVENOUS | Status: AC
  Filled 2021-01-19: qty 20

## 2021-01-19 SURGICAL SUPPLY — 35 items
APPLIER CLIP 5 13 M/L LIGAMAX5 (MISCELLANEOUS) ×2
BAG COUNTER SPONGE SURGICOUNT (BAG) ×2 IMPLANT
BLADE CLIPPER SURG (BLADE) IMPLANT
CANISTER SUCT 3000ML PPV (MISCELLANEOUS) ×2 IMPLANT
CATH REDDICK CHOLANGI 4FR 50CM (CATHETERS) ×2 IMPLANT
CHLORAPREP W/TINT 26 (MISCELLANEOUS) ×2 IMPLANT
CLIP APPLIE 5 13 M/L LIGAMAX5 (MISCELLANEOUS) ×1 IMPLANT
COVER MAYO STAND STRL (DRAPES) ×2 IMPLANT
COVER SURGICAL LIGHT HANDLE (MISCELLANEOUS) ×2 IMPLANT
DERMABOND ADVANCED (GAUZE/BANDAGES/DRESSINGS) ×1
DERMABOND ADVANCED .7 DNX12 (GAUZE/BANDAGES/DRESSINGS) ×1 IMPLANT
DRAPE C-ARM 42X120 X-RAY (DRAPES) ×2 IMPLANT
ELECT REM PT RETURN 9FT ADLT (ELECTROSURGICAL) ×2
ELECTRODE REM PT RTRN 9FT ADLT (ELECTROSURGICAL) ×1 IMPLANT
GLOVE SURG ENC MOIS LTX SZ7.5 (GLOVE) ×2 IMPLANT
GOWN STRL REUS W/ TWL LRG LVL3 (GOWN DISPOSABLE) ×3 IMPLANT
GOWN STRL REUS W/TWL LRG LVL3 (GOWN DISPOSABLE) ×6
IV CATH 14GX2 1/4 (CATHETERS) ×2 IMPLANT
KIT BASIN OR (CUSTOM PROCEDURE TRAY) ×2 IMPLANT
KIT TURNOVER KIT B (KITS) ×2 IMPLANT
NS IRRIG 1000ML POUR BTL (IV SOLUTION) ×2 IMPLANT
PAD ARMBOARD 7.5X6 YLW CONV (MISCELLANEOUS) ×2 IMPLANT
POUCH SPECIMEN RETRIEVAL 10MM (ENDOMECHANICALS) ×2 IMPLANT
SCISSORS LAP 5X35 DISP (ENDOMECHANICALS) ×2 IMPLANT
SET IRRIG TUBING LAPAROSCOPIC (IRRIGATION / IRRIGATOR) ×2 IMPLANT
SET TUBE SMOKE EVAC HIGH FLOW (TUBING) ×2 IMPLANT
SLEEVE ENDOPATH XCEL 5M (ENDOMECHANICALS) ×4 IMPLANT
SPECIMEN JAR SMALL (MISCELLANEOUS) ×2 IMPLANT
SUT MNCRL AB 4-0 PS2 18 (SUTURE) ×2 IMPLANT
TOWEL GREEN STERILE (TOWEL DISPOSABLE) ×2 IMPLANT
TOWEL GREEN STERILE FF (TOWEL DISPOSABLE) ×2 IMPLANT
TRAY LAPAROSCOPIC MC (CUSTOM PROCEDURE TRAY) ×2 IMPLANT
TROCAR XCEL BLUNT TIP 100MML (ENDOMECHANICALS) ×2 IMPLANT
TROCAR XCEL NON-BLD 5MMX100MML (ENDOMECHANICALS) ×2 IMPLANT
WATER STERILE IRR 1000ML POUR (IV SOLUTION) ×2 IMPLANT

## 2021-01-19 NOTE — Interval H&P Note (Signed)
History and Physical Interval Note:  01/19/2021 8:28 AM  [name]  has presented today for surgery, with the diagnosis of GALLSTONES.  The various methods of treatment have been discussed with the patient and family. After consideration of risks, benefits and other options for treatment, the patient has consented to  Procedure(s): LAPAROSCOPIC CHOLECYSTECTOMY WITH INTRAOPERATIVE CHOLANGIOGRAM (N/A) as a surgical intervention.  The patient's history has been reviewed, patient examined, no change in status, stable for surgery.  I have reviewed the patient's chart and labs.  Questions were answered to the patient's satisfaction.     Chevis Pretty III

## 2021-01-19 NOTE — Anesthesia Procedure Notes (Signed)
Procedure Name: Intubation Date/Time: 01/19/2021 8:46 AM Performed by: Janace Litten, CRNA Pre-anesthesia Checklist: Patient identified, Emergency Drugs available, Suction available and Patient being monitored Patient Re-evaluated:Patient Re-evaluated prior to induction Oxygen Delivery Method: Circle System Utilized Preoxygenation: Pre-oxygenation with 100% oxygen Induction Type: IV induction Ventilation: Mask ventilation without difficulty Laryngoscope Size: Mac and 3 Grade View: Grade I Tube type: Oral Tube size: 7.0 mm Number of attempts: 1 Airway Equipment and Method: Stylet and Oral airway Placement Confirmation: ETT inserted through vocal cords under direct vision, positive ETCO2 and breath sounds checked- equal and bilateral Secured at: 21 cm Tube secured with: Tape Dental Injury: Teeth and Oropharynx as per pre-operative assessment

## 2021-01-19 NOTE — Anesthesia Postprocedure Evaluation (Signed)
Anesthesia Post Note  Patient: [name]  Procedure(s) Performed: LAPAROSCOPIC CHOLECYSTECTOMY WITH INTRAOPERATIVE CHOLANGIOGRAM     Patient location during evaluation: PACU Anesthesia Type: General Level of consciousness: awake and alert Pain management: pain level controlled Vital Signs Assessment: post-procedure vital signs reviewed and stable Respiratory status: spontaneous breathing, nonlabored ventilation and respiratory function stable Cardiovascular status: blood pressure returned to baseline and stable Postop Assessment: no apparent nausea or vomiting Anesthetic complications: no   No notable events documented.  Last Vitals:  Vitals:   01/19/21 1023 01/19/21 1039  BP: 115/72 113/66  Pulse: 87 78  Resp: 14 14  Temp:    SpO2: 99% 99%    Last Pain:  Vitals:   01/19/21 1039  TempSrc:   PainSc: 5                  Lucretia Kern

## 2021-01-19 NOTE — Op Note (Signed)
01/19/2021  9:53 AM  PATIENT:  [name]  35 y.o. female  PRE-OPERATIVE DIAGNOSIS:  GALLSTONES  POST-OPERATIVE DIAGNOSIS:  GALLSTONES  PROCEDURE:  Procedure(s): LAPAROSCOPIC CHOLECYSTECTOMY WITH INTRAOPERATIVE CHOLANGIOGRAM (N/A)  SURGEON:  Surgeon(s) and Role:    * Griselda Miner, MD - Primary  PHYSICIAN ASSISTANT:   ASSISTANTS: Baldwin Crown, RNFA   ANESTHESIA:   local and general  EBL:  minimal   BLOOD ADMINISTERED:none  DRAINS: none   LOCAL MEDICATIONS USED:  MARCAINE     SPECIMEN:  Source of Specimen:  gallbladder  DISPOSITION OF SPECIMEN:  PATHOLOGY  COUNTS:  YES  TOURNIQUET:  * No tourniquets in log *  DICTATION: .Dragon Dictation    Procedure: After informed consent was obtained the patient was brought to the operating room and placed in the supine position on the operating room table. After adequate induction of general anesthesia the patient's abdomen was prepped with ChloraPrep allowed to dry and draped in usual sterile manner. An appropriate timeout was performed. The area below the umbilicus was infiltrated with quarter percent  Marcaine. A small incision was made with a 15 blade knife. The incision was carried down through the subcutaneous tissue bluntly with a hemostat and [employer] retractors. The linea alba was identified. The linea alba was incised with a 15 blade knife and each side was grasped with Coker clamps. The preperitoneal space was then probed with a hemostat until the peritoneum was opened and access was gained to the abdominal cavity. A 0 Vicryl pursestring stitch was placed in the fascia surrounding the opening. A Hassan cannula was then placed through the opening and anchored in place with the previously placed Vicryl purse string stitch. The abdomen was insufflated with carbon dioxide without difficulty. A laparoscope was inserted through the [facility] cannula in the right upper quadrant was inspected. Next the epigastric region was  infiltrated with % Marcaine. A small incision was made with a 15 blade knife. A 5 mm port was placed bluntly through this incision into the abdominal cavity under direct vision. Next 2 sites were chosen laterally on the right side of the abdomen for placement of 5 mm ports. Each of these areas was infiltrated with quarter percent Marcaine. Small stab incisions were made with a 15 blade knife. 5 mm ports were then placed bluntly through these incisions into the abdominal cavity under direct vision without difficulty. A blunt grasper was placed through the lateralmost 5 mm port and used to grasp the dome of the gallbladder and elevated anteriorly and superiorly. Another blunt grasper was placed through the other 5 mm port and used to retract the body and neck of the gallbladder. A dissector was placed through the epigastric port and using the electrocautery the peritoneal reflection at the gallbladder neck was opened. Blunt dissection was then carried out in this area until the gallbladder neck-cystic duct junction was readily identified and a good window was created. A single clip was placed on the gallbladder neck. A small  ductotomy was made just below the clip with laparoscopic scissors. A 14-gauge Angiocath was then placed through the anterior abdominal wall under direct vision. A Reddick cholangiogram catheter was then placed through the Angiocath and flushed. The catheter was then placed in the cystic duct and anchored in place with a clip. A cholangiogram was obtained that showed no filling defects good emptying into the duodenum an adequate length on the cystic duct. The anchoring clip and catheters were then removed from the patient. 3 clips  were placed proximally on the cystic duct and the duct was divided between the 2 sets of clips. Posterior to this the cystic artery was identified and again dissected bluntly in a circumferential manner until a good window  was created. 2 clips were placed proximally  and one distally on the artery and the artery was divided between the 2 sets of clips. Next a laparoscopic hook cautery device was used to separate the gallbladder from the liver bed. Prior to completely detaching the gallbladder from the liver bed the liver bed was inspected and several small bleeding points were coagulated with the electrocautery until the area was completely hemostatic. The gallbladder was then detached the rest of it from the liver bed without difficulty. A laparoscopic bag was inserted through the hassan port. The laparoscope was moved to the epigastric port. The gallbladder was placed within the bag and the bag was sealed.  The bag with the gallbladder was then removed with the [facility] cannula through the infraumbilical port without difficulty. The fascial defect was then closed with the previously placed Vicryl pursestring stitch as well as with another figure-of-eight 0 Vicryl stitch. The liver bed was inspected again and found to be hemostatic. The abdomen was irrigated with copious amounts of saline until the effluent was clear. The ports were then removed under direct vision without difficulty and were found to be hemostatic. The gas was allowed to escape. The skin incisions were all closed with interrupted 4-0 Monocryl subcuticular stitches. Dermabond dressings were applied. The patient tolerated the procedure well. At the end of the case all needle sponge and instrument counts were correct. The patient was then awakened and taken to recovery in stable condition   PLAN OF CARE: Discharge to home after PACU  PATIENT DISPOSITION:  PACU - hemodynamically stable.   Delay start of Pharmacological VTE agent (>24hrs) due to surgical blood loss or risk of bleeding: not applicable

## 2021-01-19 NOTE — Transfer of Care (Signed)
Immediate Anesthesia Transfer of Care Note  Patient: [name]  Procedure(s) Performed: LAPAROSCOPIC CHOLECYSTECTOMY WITH INTRAOPERATIVE CHOLANGIOGRAM  Patient Location: PACU  Anesthesia Type:General  Level of Consciousness: drowsy, patient cooperative and responds to stimulation  Airway & Oxygen Therapy: Patient Spontanous Breathing  Post-op Assessment: Report given to RN and Post -op Vital signs reviewed and stable  Post vital signs: Reviewed and stable  Last Vitals:  Vitals Value Taken Time  BP 116/69 01/19/21 1009  Temp    Pulse 96 01/19/21 1009  Resp 19 01/19/21 1009  SpO2 98 % 01/19/21 1009  Vitals shown include unvalidated device data.  Last Pain:  Vitals:   01/19/21 0717  TempSrc:   PainSc: 0-No pain         Complications: No notable events documented.

## 2021-01-19 NOTE — H&P (Signed)
REFERRING PHYSICIAN: Terri Piedra, MD  PROVIDER: Lindell Noe, MD  MRN: [id number] DOB: March 12, 1986  Subjective   Chief Complaint: No chief complaint on file.   History of Present Illness: [name] is a 35 y.o. female who is seen today as an office consultation at the request of Dr. Barry Dienes for evaluation of No chief complaint on file. .   We are asked to see the patient in consultation by Dr. Barry Dienes to evaluate her for gallstones. The patient is a 35 year old white female who has had several gallbladder attacks in the last 6 months or so. She has experienced significant right upper quadrant pain that radiates to the back. The pain has been associated with significant nausea and vomiting. She underwent ultrasound back in June that did show stones in the gallbladder but no gallbladder wall thickening or ductal dilatation. Her liver functions in June were normal. She is otherwise in pretty good health. She does not smoke.  Review of Systems: A complete review of systems was obtained from the patient. I have reviewed this information and discussed as appropriate with the patient. See HPI as well for other ROS.  ROS   Medical History: No past medical history on file.  Patient Active Problem List  Diagnosis   Calculus of gallbladder without cholecystitis without obstruction   No past surgical history on file.   Not on File  No current outpatient medications on file prior to visit.   No current facility-administered medications on file prior to visit.   No family history on file.   Social History   Tobacco Use  Smoking Status Not on file  Smokeless Tobacco Not on file    Social History   Socioeconomic History   Marital status: Unknown   Objective:   There were no vitals filed for this visit.  There is no height or weight on file to calculate BMI.  Physical Exam Vitals reviewed.  Constitutional:  General: She is not in acute  distress. Appearance: Normal appearance.  HENT:  Head: Normocephalic and atraumatic.  Right Ear: External ear normal.  Left Ear: External ear normal.  Nose: Nose normal.  Mouth/Throat:  Mouth: Mucous membranes are moist.  Pharynx: Oropharynx is clear.  Eyes:  General: No scleral icterus. Extraocular Movements: Extraocular movements intact.  Conjunctiva/sclera: Conjunctivae normal.  Pupils: Pupils are equal, round, and reactive to light.  Cardiovascular:  Rate and Rhythm: Normal rate and regular rhythm.  Pulses: Normal pulses.  Heart sounds: Normal heart sounds.  Pulmonary:  Effort: Pulmonary effort is normal. No respiratory distress.  Breath sounds: Normal breath sounds.  Abdominal:  General: Bowel sounds are normal.  Palpations: Abdomen is soft.  Tenderness: There is no abdominal tenderness.  Comments: There is mild tenderness in the right upper quadrant. There is no palpable mass  Musculoskeletal:  General: No swelling, tenderness or deformity. Normal range of motion.  Cervical back: Normal range of motion and neck supple.  Skin: General: Skin is warm and dry.  Coloration: Skin is not jaundiced.  Neurological:  General: No focal deficit present.  Mental Status: She is alert and oriented to person, place, and time.  Psychiatric:  Mood and Affect: Mood normal.  Behavior: Behavior normal.     Labs, Imaging and Diagnostic Testing:  Assessment and Plan:  Diagnoses and all orders for this visit:  Calculus of gallbladder without cholecystitis without obstruction    The patient appears to have symptomatic gallstones. Because of the risk of further painful episodes and possible  pancreatitis I feel she would benefit from having her gallbladder removed. She would also like to have this done. I have discussed with her in detail the risks and benefits of the operation as well as some of the technical aspects including the risk of common duct injury and she understands and  wishes to proceed. We will plan for laparoscopic cholecystectomy with intraoperative cholangiogram

## 2021-01-20 ENCOUNTER — Encounter (HOSPITAL_COMMUNITY): Payer: Self-pay | Admitting: General Surgery

## 2021-01-20 LAB — SURGICAL PATHOLOGY

## 2022-05-02 IMAGING — RF DG CHOLANGIOGRAM OPERATIVE
1 series · 4 of 4 positions shown · non-contrast
Comparison: None.

CLINICAL DATA: 35-year-old female with history of cholelithiasis.

EXAM:
INTRAOPERATIVE CHOLANGIOGRAM
TECHNIQUE: Cholangiographic images from the C-arm fluoroscopic device were
submitted for interpretation post-operatively. Please see the
procedural report for the amount of contrast and the fluoroscopy
time utilized.

[Series 1: run · 4 of 53 frames shown]
[frame 8/53]
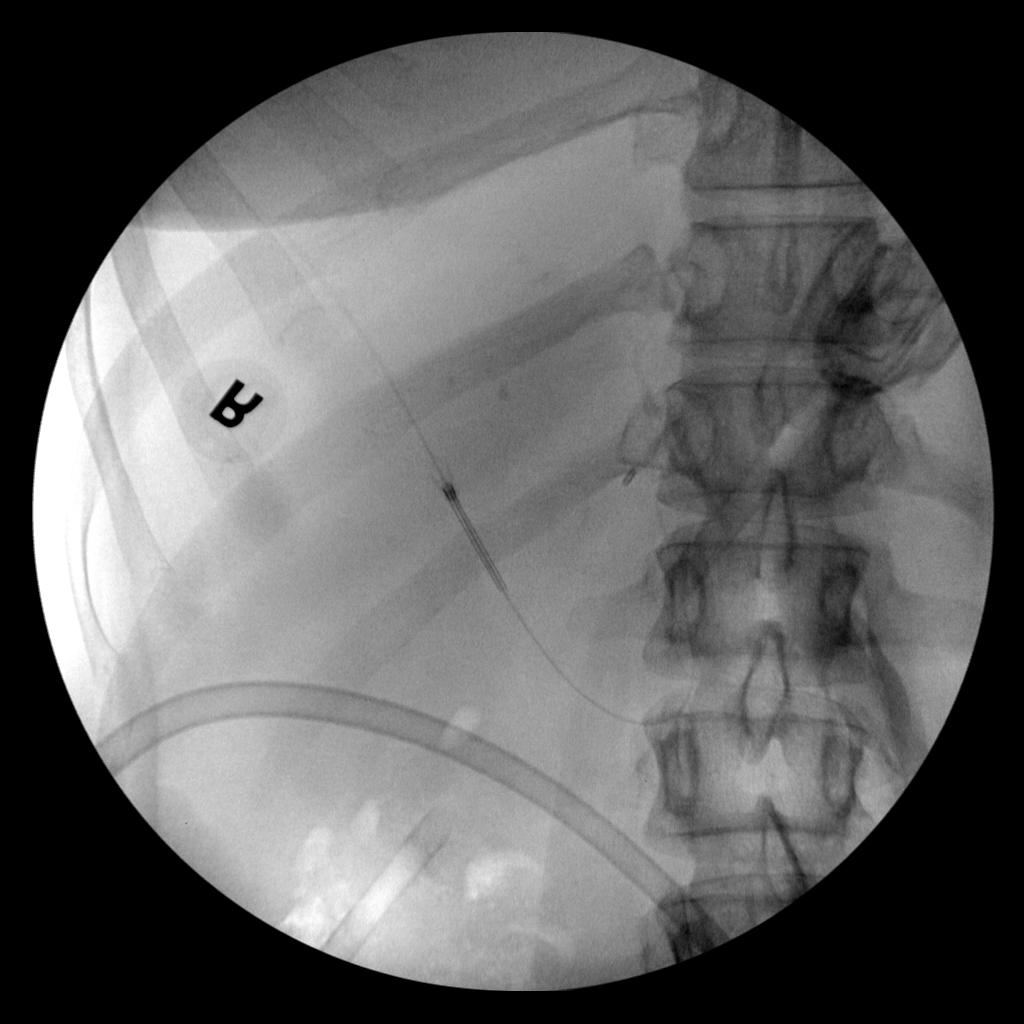
[frame 27/53]
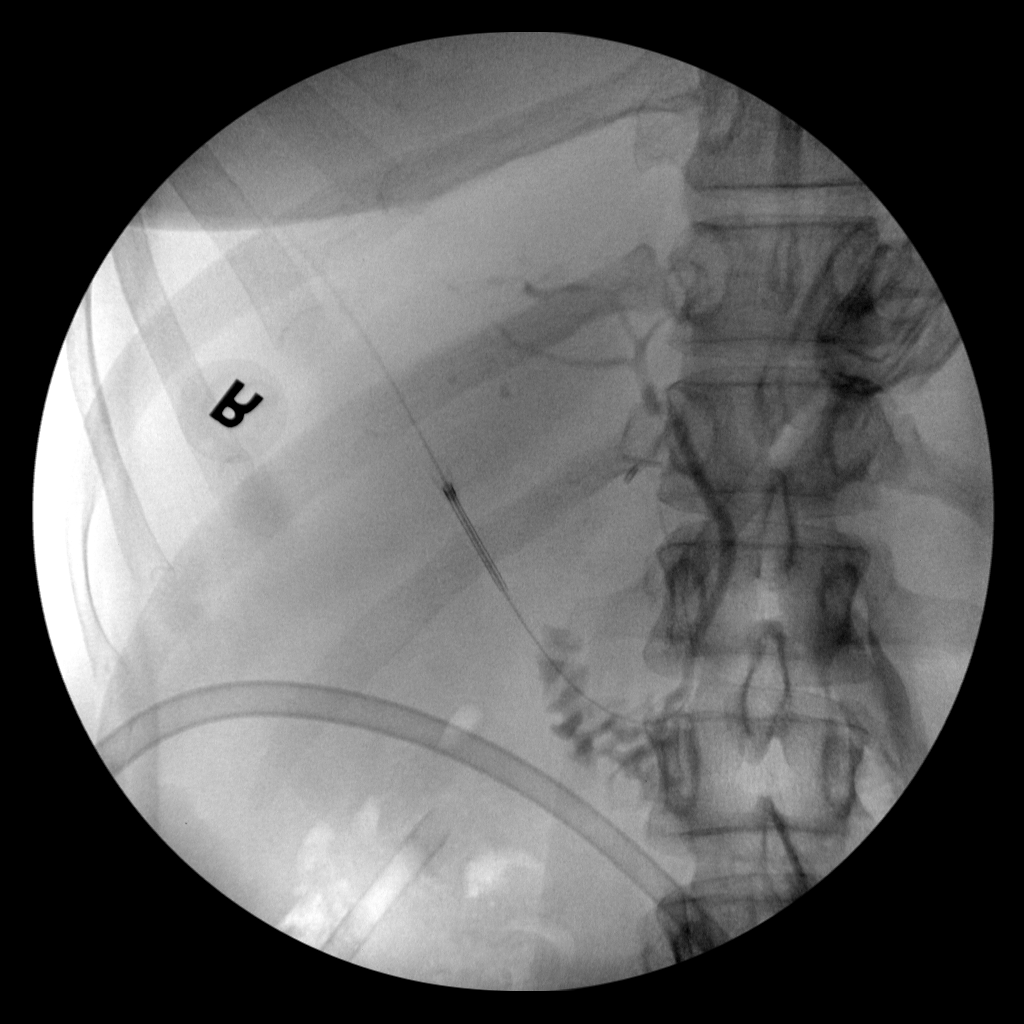
[frame 46/53]
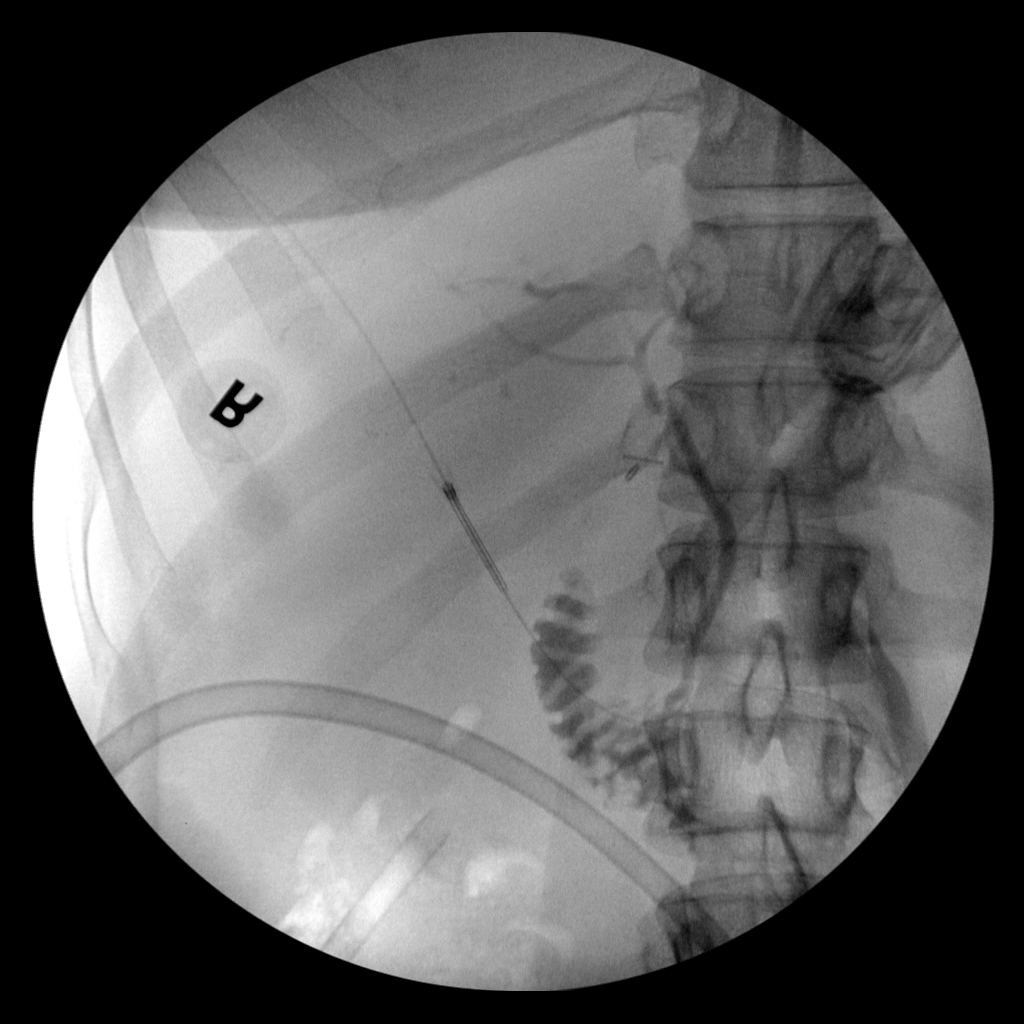
[frame 51/53]
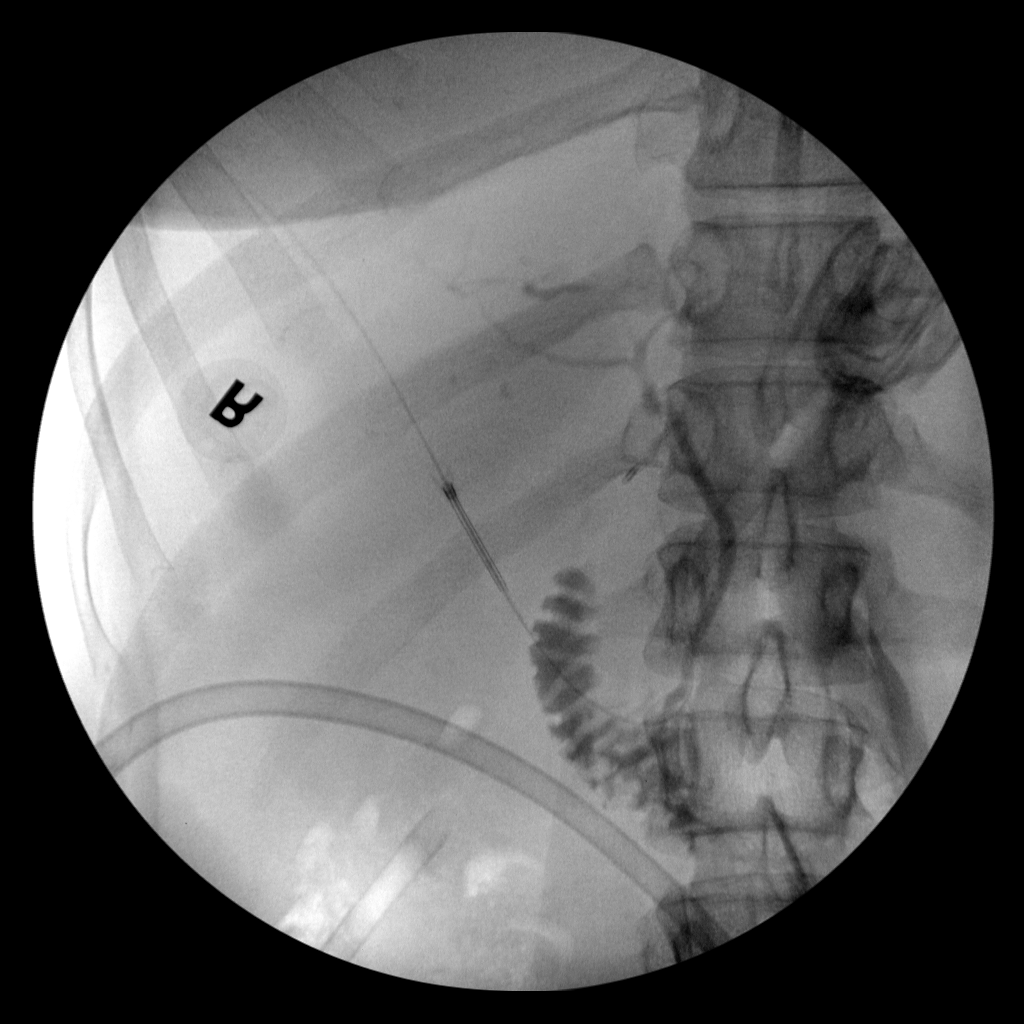

[4 of 4 positions shown; findings below may reference images not displayed]

FINDINGS: Intraoperative antegrade injection via the cystic duct which
opacifies the common bile duct and central portions of the
intrahepatic biliary tree. Contrast is visualized flowing freely
into the duodenum. There are no filling defects. No significant
intra or extrahepatic biliary ductal dilation. No apparent anomalous
anatomical configuration of the biliary tree.
IMPRESSION: No evidence of choledocholithiasis.

## 2022-10-16 ENCOUNTER — Ambulatory Visit: Payer: BC Managed Care – PPO | Admitting: Internal Medicine

## 2022-10-16 ENCOUNTER — Other Ambulatory Visit: Payer: Self-pay

## 2022-10-16 VITALS — BP 116/70 | HR 76 | Temp 98.1°F | Ht 62.99 in | Wt 179.8 lb

## 2022-10-16 DIAGNOSIS — J3089 Other allergic rhinitis: Secondary | ICD-10-CM | POA: Diagnosis not present

## 2022-10-16 DIAGNOSIS — G43809 Other migraine, not intractable, without status migrainosus: Secondary | ICD-10-CM

## 2022-10-16 DIAGNOSIS — J301 Allergic rhinitis due to pollen: Secondary | ICD-10-CM

## 2022-10-16 DIAGNOSIS — J342 Deviated nasal septum: Secondary | ICD-10-CM

## 2022-10-16 MED ORDER — FLUTICASONE PROPIONATE 50 MCG/ACT NA SUSP: 2.0000 | Freq: Every day | NASAL | 5 refills | Status: DC

## 2022-10-16 MED ORDER — AZELASTINE HCL 0.1 % NA SOLN: 1.0000 | Freq: Two times a day (BID) | NASAL | 5 refills | Status: DC | PRN

## 2022-10-16 MED ORDER — CETIRIZINE HCL 10 MG PO TABS: 10.0000 mg | ORAL_TABLET | Freq: Every day | ORAL | 5 refills | Status: DC

## 2022-10-16 NOTE — Progress Notes (Addendum)
NEW PATIENT  Date of Service/Encounter:  10/16/22  Consult requested by: Pcp, No   Subjective:   [name] (DOB: 1985-10-30) is a 37 y.o. female who presents to the clinic on 10/16/2022 with a chief complaint of Sinus Problem (Having sinus headaches, head congestion, has a lot of pressure. ENT told her it seemed like an allergy issue not a sinus problem. Maybe something in her house or something she is around. She states she gets these head aches once a month) .    History obtained from: chart review and patient.  Rhinitis:  Started about 2 years ago. Symptoms include: sinus headaches with congestion in her head, nasal congestion and post nasal drainage She also has migraines and they were changing in presentation so she was referred to ENT.  ENT evaluation was normal.    Occurs year-round Potential triggers: not sure   Treatments tried:  Claritin PRN; used it few months ago   Previous allergy testing: no History of reflux/heartburn: none History of sinus surgery: no Nonallergic triggers: none    Past Medical History: Past Medical History:  Diagnosis Date   Anemia    History of hiatal hernia 10/2020   Migraine headache    SVD (spontaneous vaginal delivery) 08/31/2016    Past Surgical History: Past Surgical History:  Procedure Laterality Date   CESAREAN SECTION     CHOLECYSTECTOMY N/A 01/19/2021   Procedure: LAPAROSCOPIC CHOLECYSTECTOMY WITH INTRAOPERATIVE CHOLANGIOGRAM;  Surgeon: Griselda Miner, MD;  Location: [facility] OR;  Service: General;  Laterality: N/A;   COLONOSCOPY  10/2020    Family History: Family History  Problem Relation Age of Onset   Hypertension Father    Cancer Maternal Grandmother        lung   Cancer Paternal Grandmother    Heart disease Paternal Grandfather    Hearing loss Neg Hx     Medication List:  Allergies as of 10/16/2022   No Known Allergies      Medication List        Accurate as of October 16, 2022  3:25 PM. If you have  any questions, ask your nurse or doctor.          acetaminophen 500 MG tablet Commonly known as: TYLENOL Take 500 mg by mouth every 6 (six) hours as needed for headache.   calcium carbonate 500 MG chewable tablet Commonly known as: TUMS - dosed in mg elemental calcium Chew 1 tablet by mouth 3 (three) times daily as needed for indigestion or heartburn.   HYDROcodone-acetaminophen 5-325 MG tablet Commonly known as: NORCO/VICODIN Take 1 tablet by mouth every 6 (six) hours as needed for moderate pain or severe pain.   norethindrone-ethinyl estradiol 1-20 MG-MCG tablet Commonly known as: LOESTRIN Take 1 tablet by mouth daily.   zonisamide 25 MG capsule Commonly known as: ZONEGRAN Take 100 mg by mouth at bedtime.       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   Objective:   Physical Exam: BP 116/70   Pulse 76   Temp 98.1 F (36.7 C)   Ht 5' 2.99" (1.6 m)   Wt 179 lb 12.8 oz (81.6 kg)   SpO2 97%   BMI 31.86 kg/m  Body mass index is 31.86 kg/m. GEN: alert, well developed HEENT: clear conjunctiva, TM grey and translucent, nose with + inferior turbinate hypertrophy, pink nasal mucosa, no rhinorrhea, slight cobblestoning, septal deviation present  HEART: regular rate and rhythm, no murmur LUNGS: clear to auscultation bilaterally, no coughing, unlabored respiration ABDOMEN: soft, non distended  SKIN: no rashes or lesions  Reviewed:  12/12/2021: seen by ENT [place] PA for headaches. No mucous production. Ct sinus all well aerated, OM complex patent, R septal deviation. Discussed possibility of allergic triggers.  Can fix septal deviation but headaches were likely migraine type.   09/30/2020: seen by GI for iron deficiency anemia. EGD and colonoscopy performed. Colonoscopy was normal. EGD with hiatal hernia.  Random biopsies obtained.   03/10/2016: seen in ED for migraines with  headaches. Given NS bolus, [place], IV benadryl, flexeril, tylenol. No focal neuro deficits.  PRN f/u with PCP.   Skin Testing:  Skin prick testing was placed, which includes aeroallergens/foods, histamine control, and saline control.  Verbal consent was obtained prior to placing test.  Patient tolerated procedure well.  Allergy testing results were read and interpreted by myself, documented by clinical staff. Adequate positive and negative control.  Results discussed with patient/family.  Airborne Adult Perc - 10/16/22 1437     Time Antigen Placed 1437    Allergen Manufacturer Waynette Buttery    Location Back    Number of Test 55    1. Control-Buffer 50% Glycerol Negative    2. Control-Histamine 3+    3. Bahia Negative    4. French Southern Territories Negative    5. Johnson Negative    6. Kentucky Blue 3+    7. Meadow Fescue 3+    8. Perennial Rye 3+    9. Timothy 3+    10. Ragweed Mix Negative    11. Cocklebur Negative    12. Plantain,  English Negative    13. Baccharis Negative    14. Dog Fennel Negative    15. Russian Thistle Negative    16. Lamb's Quarters Negative    17. Sheep Sorrell Negative    18. Rough Pigweed Negative    19. Marsh Elder, Rough Negative    20. Mugwort, Common Negative    21. Box, Elder Negative    22. Cedar, red Negative    23. Sweet Gum 3+    24. Pecan Pollen Negative    25. Pine Mix Negative    26. Walnut, Black Pollen Negative    27. Red Mulberry Negative    28. Ash Mix Negative    29. Birch Mix Negative    30. Beech American 2+    31. Cottonwood, Eastern 3+    32. Hickory, White Negative    33. Maple Mix Negative    34. Oak, Guinea-Bissau Mix Negative    35. Sycamore Eastern Negative    36. Alternaria Alternata 3+    37. Cladosporium Herbarum Negative    38. Aspergillus Mix Negative    39. Penicillium Mix 3+    40. Bipolaris Sorokiniana (Helminthosporium) 3+    41. Drechslera Spicifera (Curvularia) 3+    42. Mucor Plumbeus 2+    43. Fusarium Moniliforme Negative     44. Aureobasidium Pullulans (pullulara) Negative    45. Rhizopus Oryzae Negative    46. Botrytis Cinera 2+    47. Epicoccum Nigrum 2+    48. Phoma Betae 3+    49. Dust Mite Mix 3+    50. Cat Hair 10,000 BAU/ml Negative    51.  Dog Epithelia Negative    52. Mixed Feathers Negative    53. Horse Epithelia Negative    54. Cockroach, German 3+    55. Tobacco Leaf Negative  Intradermal - 10/16/22 1454     Time Antigen Placed 1454    Allergen Manufacturer Waynette Buttery    Location Arm    Number of Test 9    Control Negative    Bahia Negative    French Southern Territ[place] Negative    Johnson Negative    Ragweed Mix Negative    Weed Mix Negative    Mold 4 Negative    Cat Negative    Dog Negative               Assessment:   1. Nasal septal deviation   2. Other migraine without status migrainosus, not intractable   3. Non-seasonal allergic rhinitis due to pollen   4. Allergic rhinitis caused by mold   5. Allergic rhinitis due to dust mite   6. Allergic rhinitis due to insect     Plan/Recommendations:  Allergic Rhinitis Migraines  - Due to turbinate hypertrophy and unresponsive to over the counter meds, performed skin testing to identify aeroallergen triggers.   - Positive skin test 09/2022: grasses, trees, molds, dust mite, cockroach  - Avoidance measures discussed. - Use nasal saline rinses before nose sprays such as with Neilmed Sinus Rinse.  Use distilled water.   - Use Flonase 2 sprays each nostril daily. Aim upward and outward. - Use Azelastine 1-2 sprays each nostril twice daily as needed for runny nose, drainage, sneezing, congestion. Aim upward and outward. - Use Zyrtec 10 mg daily.  - Consider allergy shots as long term control of your symptoms by teaching your immune system to be more tolerant of your allergy triggers - Continue follow up with Neurology regarding migraines. Discussed that allergies can worsen migraines and cause flare ups but is not the direct cause of  it.    Nasal Septal Deviation - Follow up with ENT if symptoms worsen.  Usually septal deviation contributes to migraines only if that is leading to mucous production and issues with clearance and sinus infections.    Return in about 6 weeks (around 11/27/2022).  Alesia Morin, MD [facility]

## 2022-10-16 NOTE — Patient Instructions (Addendum)
Allergic Rhinitis Migraines  - Positive skin test 09/2022: grasses, trees, molds, dust mite, cockroach  - Avoidance measures discussed. - Use nasal saline rinses before nose sprays such as with Neilmed Sinus Rinse.  Use distilled water.   - Use Flonase 2 sprays each nostril daily. Aim upward and outward. - Use Azelastine 1-2 sprays each nostril twice daily as needed for runny nose, drainage, sneezing, congestion. Aim upward and outward. - Use Zyrtec 10 mg daily.  - Consider allergy shots as long term control of your symptoms by teaching your immune system to be more tolerant of your allergy triggers - Continue follow up with Neurology regarding chronic migraine management. Discussed that allergies can worsen migraines and cause flare ups but is not the direct cause of it.    Nasal Septal Deviation - Follow up with ENT if symptoms worsen.  Usually septal deviation contributes to migraines only if that is leading to mucous production and issues with clearance and sinus infections.     ALLERGEN AVOIDANCE MEASURES   Dust Mites Use central air conditioning and heat; and change the filter monthly.  Pleated filters work better than mesh filters.  Electrostatic filters may also be used; wash the filter monthly.  Window air conditioners may be used, but do not clean the air as well as a central air conditioner.  Change or wash the filter monthly. Keep windows closed.  Do not use attic fans.   Encase the mattress, box springs and pillows with zippered, dust proof covers. Wash the bed linens in hot water weekly.   Remove carpet, especially from the bedroom. Remove stuffed animals, throw pillows, dust ruffles, heavy drapes and other items that collect dust from the bedroom. Do not use a humidifier.   Use wood, vinyl or leather furniture instead of cloth furniture in the bedroom. Keep the indoor humidity at 30 - 40%.  Monitor with a humidity gauge.  Molds - Indoor avoidance Use air conditioning to  reduce indoor humidity.  Do not use a humidifier. Keep indoor humidity at 30 - 40%.  Use a dehumidifier if needed. In the bathroom use an exhaust fan or open a window after showering.  Wipe down damp surfaces after showering.  Clean bathrooms with a mold-killing solution (diluted bleach, or products like Tilex, etc) at least once a month. In the kitchen use an exhaust fan to remove steam from cooking.  Throw away spoiled foods immediately, and empty garbage daily.  Empty water pans below self-defrosting refrigerators frequently. Vent the clothes dryer to the outside. Limit indoor houseplants; mold grows in the dirt.  No houseplants in the bedroom. Remove carpet from the bedroom. Encase the mattress and box springs with a zippered encasing.  Molds - Outdoor avoidance Avoid being outside when the grass is being mowed, or the ground is tilled. Avoid playing in leaves, pine straw, hay, etc.  Dead plant materials contain mold. Avoid going into barns or grain storage areas. Remove leaves, clippings and compost from around the home.  Cockroach Limit spread of food around the house; especially keep food out of bedrooms. Keep food and garbage in closed containers with a tight lid.  Never leave food out in the kitchen.  Do not leave out pet food or dirty food bowls. Mop the kitchen floor and wash countertops at least once a week. Repair leaky pipes and faucets so there is no standing water to attract roaches. Plug up cracks in the house through which cockroaches can enter. Use bait stations and  approved pesticides to reduce cockroach infestation. Pollen Avoidance Pollen levels are highest during the mid-day and afternoon.  Consider this when planning outdoor activities. Avoid being outside when the grass is being mowed, or wear a mask if the pollen-allergic person must be the one to mow the grass. Keep the windows closed to keep pollen outside of the home. Use an air conditioner to filter the  air. Take a shower, wash hair, and change clothing after working or playing outdoors during pollen season.

## 2022-11-27 ENCOUNTER — Ambulatory Visit: Payer: BC Managed Care – PPO | Admitting: Internal Medicine

## 2022-12-05 ENCOUNTER — Ambulatory Visit: Payer: BC Managed Care – PPO | Admitting: Internal Medicine

## 2022-12-05 ENCOUNTER — Encounter: Payer: Self-pay | Admitting: Internal Medicine

## 2022-12-05 VITALS — BP 98/70 | HR 88 | Temp 98.4°F | Wt 186.6 lb

## 2022-12-05 DIAGNOSIS — J302 Other seasonal allergic rhinitis: Secondary | ICD-10-CM | POA: Diagnosis not present

## 2022-12-05 DIAGNOSIS — J342 Deviated nasal septum: Secondary | ICD-10-CM

## 2022-12-05 DIAGNOSIS — J3089 Other allergic rhinitis: Secondary | ICD-10-CM | POA: Diagnosis not present

## 2022-12-05 DIAGNOSIS — G43809 Other migraine, not intractable, without status migrainosus: Secondary | ICD-10-CM

## 2022-12-05 MED ORDER — FLUTICASONE PROPIONATE 50 MCG/ACT NA SUSP: 2.0000 | Freq: Every day | NASAL | 5 refills | Status: DC

## 2022-12-05 MED ORDER — AZELASTINE HCL 0.1 % NA SOLN: 1.0000 | Freq: Two times a day (BID) | NASAL | 5 refills | Status: DC | PRN

## 2022-12-05 MED ORDER — CETIRIZINE HCL 10 MG PO TABS: 10.0000 mg | ORAL_TABLET | Freq: Every day | ORAL | 5 refills | Status: AC

## 2022-12-05 NOTE — Patient Instructions (Addendum)
Allergic Rhinitis Migraines  Nasal Septal Deviation - Positive skin test 09/2022: grasses, trees, molds, dust mite, cockroach  - Avoidance measures discussed. - Use nasal saline rinses before nose sprays such as with Neilmed Sinus Rinse.  Use distilled water.   - Use Flonase 2 sprays each nostril daily. Aim upward and outward. - Use Azelastine 1-2 sprays each nostril twice daily as needed for runny nose, drainage, sneezing, congestion. Aim upward and outward. - Use Zyrtec 10 mg daily.  - Consider allergy shots as long term control of your symptoms by teaching your immune system to be more tolerant of your allergy triggers - Continue follow up with Neurology regarding chronic migraine management. Discussed that allergies can worsen migraines and cause flare ups but is not the direct cause of it.

## 2022-12-05 NOTE — Progress Notes (Signed)
FOLLOW UP Date of Service/Encounter:  12/05/22   Subjective:  [name] (DOB: 12-07-1985) is a 37 y.o. female who [facility] for follow up for allergic rhinoconjunctivitis.  Also with migraines and nasal septal deviation.   History obtained from: chart review and patient. Last visit was with me on 10/16/2022 and at the time was SPT positive to multiple allergens. Started on Flonase, Azelastine PRN and Zyrtec.  Discussed uncontrolled sinus issues could contribute to migraines.  Since last visit, reports doing better.  Had a headache lasting less than 24 hours about 2 weeks ago but tolerable.  Not having much congestion, rhinorrhea, itchy watery eyes or sneezing.  Taking Flonase and Zyrtec daily. Rarely needs Azelastine.    Past Medical History: Past Medical History:  Diagnosis Date   Anemia    History of hiatal hernia 10/2020   Migraine headache    SVD (spontaneous vaginal delivery) 08/31/2016    Objective:  BP 98/70   Pulse 88   Temp 98.4 F (36.9 C) (Temporal)   Wt 186 lb 9.6 oz (84.6 kg)   SpO2 96%   BMI 33.06 kg/m  Body mass index is 33.06 kg/m. Physical Exam: GEN: alert, well developed HEENT: clear conjunctiva, nose with mild inferior turbinate hypertrophy, pink nasal mucosa, slight clear rhinorrhea, no cobblestoning HEART: regular rate and rhythm, no murmur LUNGS: clear to auscultation bilaterally, no coughing, unlabored respiration SKIN: no rashes or lesions  Assessment:   1. Seasonal and perennial allergic rhinitis   2. Other migraine without status migrainosus, not intractable   3. Nasal septal deviation     Plan/Recommendations:  Allergic Rhinitis Migraines  Nasal Septal Deviation - Improved with use of Flonase + Zyrtec.  - Positive skin test 09/2022: grasses, trees, molds, dust mite, cockroach  - Avoidance measures discussed. - Use nasal saline rinses before nose sprays such as with Neilmed Sinus  Rinse.  Use distilled water.   - Use Flonase 2 sprays each nostril daily. Aim upward and outward. - Use Azelastine 1-2 sprays each nostril twice daily as needed for runny nose, drainage, sneezing, congestion. Aim upward and outward. - Use Zyrtec 10 mg daily.  - Consider allergy shots as long term control of your symptoms by teaching your immune system to be more tolerant of your allergy triggers - Continue follow up with Neurology regarding chronic migraine management. Discussed that allergies can worsen migraines and cause flare ups but is not the direct cause of it.       Return in about 4 months (around 04/06/2023).  Alesia Morin, MD [facility] [place]

## 2023-04-05 ENCOUNTER — Ambulatory Visit: Payer: BC Managed Care – PPO | Admitting: Internal Medicine

## 2023-04-19 ENCOUNTER — Ambulatory Visit: Payer: 59 | Admitting: Internal Medicine

## 2023-04-19 ENCOUNTER — Encounter: Payer: Self-pay | Admitting: Internal Medicine

## 2023-04-19 ENCOUNTER — Other Ambulatory Visit: Payer: Self-pay

## 2023-04-19 VITALS — BP 100/68 | HR 90 | Temp 98.4°F | Ht 62.21 in | Wt 196.3 lb

## 2023-04-19 DIAGNOSIS — J302 Other seasonal allergic rhinitis: Secondary | ICD-10-CM | POA: Diagnosis not present

## 2023-04-19 DIAGNOSIS — J3089 Other allergic rhinitis: Secondary | ICD-10-CM

## 2023-04-19 MED ORDER — AZELASTINE HCL 0.1 % NA SOLN: 2.0000 | Freq: Two times a day (BID) | NASAL | 5 refills | Status: DC | PRN

## 2023-04-19 MED ORDER — FLUTICASONE PROPIONATE 50 MCG/ACT NA SUSP: 2.0000 | Freq: Every day | NASAL | 5 refills | Status: AC

## 2023-04-19 MED ORDER — LEVOCETIRIZINE DIHYDROCHLORIDE 5 MG PO TABS: 5.0000 mg | ORAL_TABLET | Freq: Every evening | ORAL | 5 refills | Status: AC

## 2023-04-19 NOTE — Patient Instructions (Addendum)
Allergic Rhinitis - Positive skin test 09/2022: grasses, trees, molds, dust mite, cockroach  - Avoidance measures discussed. - Use nasal saline rinses before nose sprays such as with Neilmed Sinus Rinse.  Use distilled water.   - Use Flonase 2 sprays each nostril daily. Aim upward and outward. - Use Azelastine 1-2 sprays each nostril twice daily as needed for runny nose, drainage, sneezing, congestion. Aim upward and outward. While flared up, at least use 2 sprays each nostril every evening.  - Use Zyrtec 10 mg daily or Allegra 180mg  daily or Xyzal 5mg  daily.   - Consider allergy shots as long term control of your symptoms by teaching your immune system to be more tolerant of your allergy triggers. Discuss allergy shot cost and coverage with insurance company; if interested in starting, call us back and we will get the vials mixed, Epipen sent and shots started.

## 2023-04-19 NOTE — Progress Notes (Signed)
   FOLLOW UP Date of Service/Encounter:  04/19/23   Subjective:  [name] (DOB: 09/01/1985) is a 38 y.o. female who [facility] for follow up for allergic rhinitis. Also has migraines and nasal septal deviation.   History obtained from: chart review and patient. Last visit was with me on 12/05/2022 and at the time was doing well on Flonase/Zyrtec/Azelastine.    Since last visit, reports medications were working very well initially but now has frequent congestion and drainage resulting in headaches/sinus pain and migraines. Taking Flonase and Zyrtec daily with some days of Azelastine.  Not much ocular symptoms.   Past Medical History: Past Medical History:  Diagnosis Date   Anemia    History of hiatal hernia 10/2020   Migraine headache    SVD (spontaneous vaginal delivery) 08/31/2016    Objective:  BP 100/68 (BP Location: Left Arm, Patient Position: Sitting, Cuff Size: Normal)   Pulse 90   Temp 98.4 F (36.9 C) (Temporal)   Ht 5' 2.21" (1.58 m)   Wt 196 lb 4.8 oz (89 kg)   SpO2 97%   BMI 35.67 kg/m  Body mass index is 35.67 kg/m. Physical Exam: GEN: alert, well developed HEENT: clear conjunctiva, nose with mild inferior turbinate hypertrophy, pink nasal mucosa, + clear rhinorrhea,no cobblestoning HEART: regular rate and rhythm, no murmur LUNGS: clear to auscultation bilaterally, no coughing, unlabored respiration SKIN: no rashes or lesions   Assessment:   1. Seasonal and perennial allergic rhinitis     Plan/Recommendations:   Allergic Rhinitis - Uncontrolled, discussed adding INAH daily and strongly consider AIT. Will also try alternative oral anti histamine.  - Positive skin test 09/2022: grasses, trees, molds, dust mite, cockroach  - Avoidance measures discussed. - Use nasal saline rinses before nose sprays such as with Neilmed Sinus Rinse.  Use distilled water.   - Use Flonase 2 sprays each nostril daily. Aim  upward and outward. - Use Azelastine 1-2 sprays each nostril twice daily as needed for runny nose, drainage, sneezing, congestion. Aim upward and outward. While flared up, at least use 2 sprays each nostril every evening.  - Use Zyrtec 10 mg daily or Allegra 180mg  daily or Xyzal 5mg  daily.   - Consider allergy shots as long term control of your symptoms by teaching your immune system to be more tolerant of your allergy triggers. Discuss allergy shot cost and coverage with insurance company; if interested in starting, call us back and we will get the vials mixed, Epipen sent and shots started.       Return in about 6 months (around 10/17/2023).  Alesia Morin, MD [facility] [place]

## 2023-11-05 ENCOUNTER — Other Ambulatory Visit: Payer: Self-pay | Admitting: Internal Medicine

## 2023-12-08 ENCOUNTER — Other Ambulatory Visit: Payer: Self-pay | Admitting: Internal Medicine

## 2024-01-11 ENCOUNTER — Other Ambulatory Visit: Payer: Self-pay | Admitting: Internal Medicine
